# Patient Record
Sex: Female | Born: 1955 | Race: White | Hispanic: No | Marital: Married | Smoking: Current every day smoker
Health system: Southern US, Community
[De-identification: ages and names within clinical notes are randomized; demographics above are authoritative.]

## PROBLEM LIST (undated history)

## (undated) DIAGNOSIS — M1990 Unspecified osteoarthritis, unspecified site: Secondary | ICD-10-CM

## (undated) DIAGNOSIS — R7303 Prediabetes: Secondary | ICD-10-CM

## (undated) DIAGNOSIS — R112 Nausea with vomiting, unspecified: Secondary | ICD-10-CM

## (undated) DIAGNOSIS — J189 Pneumonia, unspecified organism: Secondary | ICD-10-CM

## (undated) DIAGNOSIS — D649 Anemia, unspecified: Secondary | ICD-10-CM

## (undated) DIAGNOSIS — Z9889 Other specified postprocedural states: Secondary | ICD-10-CM

## (undated) DIAGNOSIS — R51 Headache: Secondary | ICD-10-CM

## (undated) DIAGNOSIS — T7840XA Allergy, unspecified, initial encounter: Secondary | ICD-10-CM

## (undated) DIAGNOSIS — I10 Essential (primary) hypertension: Secondary | ICD-10-CM

## (undated) DIAGNOSIS — E7800 Pure hypercholesterolemia, unspecified: Secondary | ICD-10-CM

## (undated) DIAGNOSIS — R519 Headache, unspecified: Secondary | ICD-10-CM

## (undated) DIAGNOSIS — J45909 Unspecified asthma, uncomplicated: Secondary | ICD-10-CM

## (undated) HISTORY — DX: Unspecified osteoarthritis, unspecified site: M19.90

## (undated) HISTORY — DX: Unspecified asthma, uncomplicated: J45.909

## (undated) HISTORY — DX: Allergy, unspecified, initial encounter: T78.40XA

## (undated) HISTORY — PX: TONSILLECTOMY: SUR1361

---

## 1985-02-12 HISTORY — PX: BREAST SURGERY: SHX581

## 1985-02-12 HISTORY — PX: TUBAL LIGATION: SHX77

## 1987-02-13 HISTORY — PX: ABDOMINAL HYSTERECTOMY: SHX81

## 1990-02-12 HISTORY — PX: LAPAROSCOPY: SHX197

## 1995-02-13 HISTORY — PX: OTHER SURGICAL HISTORY: SHX169

## 1998-02-12 HISTORY — PX: CHOLECYSTECTOMY: SHX55

## 1998-02-12 HISTORY — PX: APPENDECTOMY: SHX54

## 2001-02-12 HISTORY — PX: ELBOW SURGERY: SHX618

## 2001-10-29 ENCOUNTER — Encounter: Payer: Self-pay | Admitting: Neurosurgery

## 2001-10-31 ENCOUNTER — Ambulatory Visit (HOSPITAL_COMMUNITY): Admission: RE | Admit: 2001-10-31 | Discharge: 2001-11-01 | Payer: Self-pay | Admitting: Neurosurgery

## 2003-02-13 HISTORY — PX: BLADDER SURGERY: SHX569

## 2003-02-13 HISTORY — PX: ANKLE SURGERY: SHX546

## 2003-04-15 ENCOUNTER — Ambulatory Visit (HOSPITAL_BASED_OUTPATIENT_CLINIC_OR_DEPARTMENT_OTHER): Admission: RE | Admit: 2003-04-15 | Discharge: 2003-04-15 | Payer: Self-pay | Admitting: Urology

## 2003-04-15 ENCOUNTER — Ambulatory Visit (HOSPITAL_COMMUNITY): Admission: RE | Admit: 2003-04-15 | Discharge: 2003-04-15 | Payer: Self-pay | Admitting: Urology

## 2003-05-24 ENCOUNTER — Ambulatory Visit (HOSPITAL_COMMUNITY): Admission: RE | Admit: 2003-05-24 | Discharge: 2003-05-24 | Payer: Self-pay

## 2004-01-26 ENCOUNTER — Ambulatory Visit (HOSPITAL_BASED_OUTPATIENT_CLINIC_OR_DEPARTMENT_OTHER): Admission: RE | Admit: 2004-01-26 | Discharge: 2004-01-26 | Payer: Self-pay | Admitting: Orthopedic Surgery

## 2005-02-12 HISTORY — PX: COLONOSCOPY: SHX174

## 2008-02-25 ENCOUNTER — Encounter: Admission: RE | Admit: 2008-02-25 | Discharge: 2008-02-25 | Payer: Self-pay | Admitting: Family Medicine

## 2010-02-12 HISTORY — PX: MENISCUS REPAIR: SHX5179

## 2010-06-30 NOTE — Op Note (Signed)
NAMECLAY, MENSER             ACCOUNT NO.:  [id number]   MEDICAL RECORD NO.:  [id number]          PATIENT TYPE:  AMB   LOCATION:  DSC                          FACILITY:  [facility]   PHYSICIAN:  Loreta Ave, M.D. DATE OF BIRTH:  10/06/1955   DATE OF PROCEDURE:  01/26/2004  DATE OF DISCHARGE:                                 OPERATIVE REPORT   PREOPERATIVE DIAGNOSES:  1.  Right ankle anterior bony impingement with tibiotalar spurs and      synovitis.  2.  Spur distal and medial tibia, symptomatic.   POSTOPERATIVE DIAGNOSES:  1.  Right ankle anterior bony impingement with tibiotalar spurs and      synovitis.  2.  Ganglion arising off posterior tibial tendon sheath, extending up over      the medial aspect of the tibia, simulating a firm body like a spur but      no bony spur present.   PROCEDURE:  1.  Right ankle exam under anesthesia arthroscopy with synovectomy and      extensive debridement of the anterior spurs to obliterate the anterior      bony impingement.  2.  Open excision of distal medial ganglion and exploration down into      posterior tibial tendon sheath.   SURGEON:  Loreta Ave, M.D.   ASSISTANT:  Zonia Kief, PA-C   ANESTHESIA:  General.   BLOOD LOSS:  Minimal.   TOURNIQUET TIME:  45 minutes.   SPECIMENS:  None.   CULTURES:  None.   COMPLICATIONS:  None.   DRESSINGS:  Soft compressive.   DESCRIPTION OF PROCEDURE:  The patient brought to the operating room, placed  on the operating table in supine position.  After adequate anesthesia had  been obtained, right ankle examined.  Good motion and stability.  Fluoroscopic guidance.  Confirmed anterior bony impingement between the  tibia and talus with about 7 degrees of dorsiflexion.  I could not really  see a bony spur along the mass on the medial aspect of the tibia.  A stirrup  applied.  Prepped and draped in the usual sterile fashion.  Extremity  elevated with elevation of Esmarch,  tourniquet inflated to 300 mmHg.  Longitudinal incision over the posteromedial aspect distal tibia just above  the medial malleolus.  Skin and subcutaneous tissue divided.  The mass,  which I thought was a spur, turned out to be a very firm cyst almost 1 cm in  diameter, excised in its entirety and tracked down behind the tibia into the  posterior tibial tendon sheath.  The neck of the ganglion and a small window  in the sheath was opened up.  I did not compromise the sheath enough to  allow any subluxation.  Some synovitis within the sheath debrided.  The  tendon below the subluxation was intact.  The wound irrigated.  Skin closed  with nylon.  Margin of the wound injected with Marcaine.  I then made  anteromedial and anterolateral portals in the ankle, protecting  neurovascular structures.  The ankle was entered with the scope, distended  and inspected.  The  anterior synovitis debrided.  Confirmed the anterior  bony spurs medial half of the tibia and on the medial aspect of the talus  that caused bone-on-bone abutment and about 7 degrees of dorsiflexion.  All  spurs were sequentially removed with a shaver and bur so at completion, I  had full dorsiflexion without any bony impingement, confirmed both  arthroscopically as well as fluoroscopically.  Remaining ankle examined  including posterior recess in both gutters.  Noted degenerative changes, no  other chondral loose bodies.  No OCD.  Instruments and fluid removed.  Portals and ankle injected with Marcaine and portals closed with nylon.  Sterile compressive dressing applied.  Tourniquet deflated and removed.  Anesthesia reversed.  Brought to recovery room.  Tolerated the surgery well,  no complications.      Valentino Saxon   DFM/MEDQ  D:  01/26/2004  T:  01/26/2004  Job:  [id number]

## 2010-06-30 NOTE — Op Note (Signed)
NAME:  Mercedes Lucas, Mercedes Lucas                       ACCOUNT NO.:  [id number]   MEDICAL RECORD NO.:  [id number]                   PATIENT TYPE:  AMB   LOCATION:  N[facility]C                                 FACILITY:  [facility]   PHYSICIAN:  Excell Seltzer. Annabell Howells, M.D.                 DATE OF BIRTH:  August 09, 1955   DATE OF PROCEDURE:  04/15/2003  DATE OF DISCHARGE:                                 OPERATIVE REPORT   PROCEDURE:  Cystoscopy, hydrodistention of the bladder, urethral dilation,  instillation of Pyridium and Marcaine.   PREOPERATIVE DIAGNOSIS:  Urgency, frequency.  Rule out interstitial  cystitis.   POSTOPERATIVE DIAGNOSIS:  Urgency, frequency.  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   SURGEON:  Excell Seltzer. Annabell Howells, M.D.   ANESTHESIA:  General.   COMPLICATIONS:  None.   INDICATIONS:  Ms. Mercedes Lucas is a 55 year old white female who had a history of  bladder tack in 1997 and who has developed some progressive problems with  urinary frequency, intermittent small voids, and a sensation of incomplete  emptying.  She has failed multiple anticholinergics.  Urodynamics revealed  detrusor instability with a bladder capacity that was quite small, only  about 50 cc before urge incontinence.  She recently underwent a potassium  sensitivity test which was mildly positive, and it was felt that cysto,  hydrodistention, urethral dilatation were indicated to rule out IC and  urethral stenosis.   FINDINGS/PROCEDURE:  Patient is given p.o. Cipro.  She is taken to the  operating room, where a general anesthetic was induced.  She is placed in  the lithotomy position.  Her perineum and genitalia were prepped with  Betadine solution.  She was draped in the usual sterile fashion.  Cystoscopy  was performed using the 22 [place] scope and 12 and 70 degree lenses.  There  was some initial difficulty passing the scope due to meatal stenosis;  however, I was able to get the scope in.  The urethra felt somewhat  fixed,  making it difficult to angulate the scope.  This would be consistent with  her prior bladder suspension.  Examination of the bladder revealed mild  trabeculation.  Ureteral orifices were in the normal anatomic position,  effluxing clear urine.  On the anterior bladder neck, there was a slightly  protruding, pale area that appeared consistent with scarring associated with  an anterior urethropexy.  Nothing suspicious for tumor was noted.  Initially, no mucosal abnormalities were appreciated.  Once thorough  cystoscopic examination had been performed, the cystoscope was removed, a  Foley catheter was inserted, the balloon was filled, and the bladder was  then distended under 80 cm of water pressure to capacity.  This was held for  five minutes.  The bladder was then drained.  The drainage was quite bloody.  The capacity under anesthesia was only 500 cc.  Recystoscopy after  hydrodistention demonstrated diffuse glomerular hemorrhaging, particularly  in  the trigone but no area of the bladder was spared.  At this point, the  bladder was drained.  The urethra was calibrated with sounds to 34.  There  was a little bit of splitting at the meatus at the 28 [place] sound level,  suggesting some degree of urethral stenosis.  Once the urethral  calibration/dilation had been performed, the bladder was instilled with  30 cc of 0.25% Marcaine with 400 mg of crushed Pyridium.  The sound was  removed, and a B&O suppository was placed.  The patient's anesthetic was  reversed.  She was taken down from the lithotomy position.  She left the  recovery room in stable condition.  There were no complications.                                               Excell Seltzer. Annabell Howells, M.D.    JJW/MEDQ  D:  04/15/2003  T:  04/15/2003  Job:  [id number]   cc:   Stacie Acres. White, M.D.  [address]  [place]  [place] [address]  Fax: [phone]

## 2010-06-30 NOTE — Op Note (Signed)
   NAME:  Mercedes Lucas, Mercedes Lucas                       ACCOUNT NO.:  [id number]   MEDICAL RECORD NO.:  [id number]                   PATIENT TYPE:  OIB   LOCATION:  3015                                 FACILITY:  [facility]   PHYSICIAN:  Coletta Memos, M.D.                  DATE OF BIRTH:  1955-05-30   DATE OF PROCEDURE:  12/19/2001  DATE OF DISCHARGE:  11/01/2001                                 OPERATIVE REPORT   PREOPERATIVE DIAGNOSIS:  Left tardy median palsy.   POSTOPERATIVE DIAGNOSIS:  Left tardy median palsy.   PROCEDURE:  Left ulnar nerve decompression.   SURGEON:  Coletta Memos, M.D.   ASSISTANT:  Danae Orleans. Venetia Maxon, M.D.   ANESTHESIA:  General endotracheal.   COMPLICATIONS:  None.   INDICATIONS:  The patient has a left ulnar nerve palsy.  I recommended and  she agreed to undergo decompression.   DESCRIPTION OF PROCEDURE:  The patient is brought to the operating room  today, intubated and placed under general anesthesia without difficulty.  Local anesthetic was placed and the proposed on the medial aspect of the  left arm and forearm.  This was done in a crescentic shape with the high  point of the crescent over the area of the medial epicondyle.  I opened the  incision and took this down carefully using bipolar cautery to control  bleeding of the subcutaneous tissues.  Cutaneous nerves were spared if  possible.  I then was able to identify the medial epicondyle and the  muscular septum.  Then with further dissection was able to identify the  cubital tunnel.  With Dr. Fredrich Birks assistance, the cubital tunnel was opened  and then this was done both proximally and distally until I was satisfied  that the nerve was well decompressed.  I was.  It did not sublux when the  patient's arm was flexed in the operation so I do not feel that  transposition was necessary.  I irrigated the wound.  I then closed the  wound in a weighted fashion using Vicryl sutures.  The patient tolerated the  procedure well after sterile dressing was applied.  She was able to move her  hand and the ulnar nerve and innovated muscles also.                                               Coletta Memos, M.D.    KC/MEDQ  D:  12/19/2001  T:  12/21/2001  Job:  [id number]

## 2012-02-20 ENCOUNTER — Other Ambulatory Visit: Payer: Self-pay | Admitting: Family Medicine

## 2012-02-20 DIAGNOSIS — Z1231 Encounter for screening mammogram for malignant neoplasm of breast: Secondary | ICD-10-CM

## 2012-02-21 ENCOUNTER — Ambulatory Visit
Admission: RE | Admit: 2012-02-21 | Discharge: 2012-02-21 | Disposition: A | Discharge status: Home / Self Care | Payer: 59 | Source: Ambulatory Visit | Attending: Family Medicine | Admitting: Family Medicine

## 2012-02-21 DIAGNOSIS — Z1231 Encounter for screening mammogram for malignant neoplasm of breast: Secondary | ICD-10-CM

## 2012-03-05 ENCOUNTER — Ambulatory Visit: Payer: 59

## 2012-03-05 ENCOUNTER — Ambulatory Visit (INDEPENDENT_AMBULATORY_CARE_PROVIDER_SITE_OTHER): Payer: 59 | Admitting: Family Medicine

## 2012-03-05 VITALS — BP 148/84 | HR 82 | Temp 97.9°F | Resp 16 | Ht 65.0 in | Wt 170.0 lb

## 2012-03-05 DIAGNOSIS — S63259A Unspecified dislocation of unspecified finger, initial encounter: Secondary | ICD-10-CM

## 2012-03-05 DIAGNOSIS — S62609A Fracture of unspecified phalanx of unspecified finger, initial encounter for closed fracture: Secondary | ICD-10-CM

## 2012-03-05 DIAGNOSIS — M79646 Pain in unspecified finger(s): Secondary | ICD-10-CM

## 2012-03-05 DIAGNOSIS — M79609 Pain in unspecified limb: Secondary | ICD-10-CM

## 2012-03-05 MED ORDER — TRAMADOL HCL 50 MG PO TABS: 50.0000 mg | ORAL_TABLET | Freq: Three times a day (TID) | ORAL | Status: DC | PRN

## 2012-03-05 NOTE — Progress Notes (Signed)
Chief complaint fall at work with left hand pain, and back pain.  History of present illness: Patient is a very pleasant 57 year old female who today when she was getting out of her car slipped on ice and fell onto her back. Patient did attempt to try to catch herself with her left hand and twisted a couple of her fingers. Patient states that she had pain in the hand as well as pain on her buttocks as well as her upper back. Patient then had enough pain that she decided to come here to be evaluated. Patient states that this did occur approximately 2-3 hours ago. Patient has not taken anything for pain at this time. Of all the injury is the thing that hurts most is the finger pain. Patient states that this feels somewhat separately and sensation and has trouble moving her fingers. Patient does notice some swelling of the fingers mostly being the ring and middle finger.  Past Medical History  Diagnosis Date  . Allergy   . Asthma   . Arthritis    Past Surgical History  Procedure Date  . Appendectomy   . Breast surgery   . Cholecystectomy   . Abdominal hysterectomy   . Tubal ligation    Family History  Problem Relation Age of Onset  . Cancer Mother   . Cancer Father   . Heart disease Father    History  Substance Use Topics  . Smoking status: Current Every Day Smoker -- 0.5 packs/day for 20 years  . Smokeless tobacco: Not on file  . Alcohol Use: No   Physical exam Blood pressure 148/84, pulse 82, temperature 97.9 F (36.6 C), temperature source Oral, resp. rate 16, height 5\' 5"  (1.651 m), weight 170 lb (77.111 kg), SpO2 97.00%. General: No apparent distress alert and oriented x3 Left hand exam: On inspection patient does have trace effusion of the DMP and PIP joints of the ring and middle finger. There is no significant angulation of the that his fingers. She is neurovascularly intact distally. Patient's metacarpals as well as carpal bones are nontender on exam. Patient has no pain over  the distal radius or ulna. Patient has no pain at the elbow. Patient does have full flexion and extension of the DIP joints bilaterally. Back exam: On inspection patient does have a small abrasion in the mid of her back in the thoracic area just to the right of midline. This is very tender to touch and does have some signs of bruising already. Patient has no spinous process tenderness. Patient has full-strength of her upper extremities. Patient is minorly tender over the sacrum but overall is able to sit without any pain. Patient has a negative straight leg test bilaterally and a negative Faber test bilaterally.  X-rays were ordered and reviewed by me today. Patient did have a subluxation of the middle finger on the left hand as well as a volar plate fracture that is extra-articular.  Assessment: Fuller plate fracture of the middle finger on the left hand after subluxation which was reduced today. Patient tolerated the procedure well without any significant side effect. Patient was put in a 30 flexion splint which she was told to wear for 3 weeks. Patient was given a prescription for tramadol to help with pain. Patient Re: has a prescription for meloxicam. Patient will followup again in 2-3 weeks for followup x-rays to make sure healing well and pain is improving.  Regarding her back abrasion. She will continue with the tramadol and they will  improve slowly. Warned patient that likely she will be somewhat this for tomorrow. We will keep her out of work for one day and then she can resume work regularly without any restrictions on Friday. Patient will call if she has any setbacks.

## 2012-03-11 NOTE — Addendum Note (Signed)
Addended by: Ethelda Chick on: 03/11/2012 02:04 PM   Modules accepted: Level of Service

## 2012-03-11 NOTE — Progress Notes (Signed)
Reviewed history and physical exam in detail with Antoine Primas, DO.  Reviewed xrays in detail.  Agree with current assessment and plan.

## 2012-03-12 NOTE — Progress Notes (Signed)
Reviewed and agree.

## 2012-03-25 ENCOUNTER — Ambulatory Visit: Payer: 59

## 2012-03-25 ENCOUNTER — Ambulatory Visit (INDEPENDENT_AMBULATORY_CARE_PROVIDER_SITE_OTHER): Payer: 59 | Admitting: Family Medicine

## 2012-03-25 VITALS — BP 156/89 | HR 77 | Temp 98.4°F | Resp 17 | Ht 66.0 in | Wt 170.0 lb

## 2012-03-25 DIAGNOSIS — S62609A Fracture of unspecified phalanx of unspecified finger, initial encounter for closed fracture: Secondary | ICD-10-CM

## 2012-03-25 DIAGNOSIS — IMO0002 Reserved for concepts with insufficient information to code with codable children: Secondary | ICD-10-CM

## 2012-03-25 DIAGNOSIS — Z09 Encounter for follow-up examination after completed treatment for conditions other than malignant neoplasm: Secondary | ICD-10-CM

## 2012-03-25 NOTE — Patient Instructions (Addendum)
Thank you for coming in today. We will buddy tape your fingers.  Go back to work Monday.  Come back in 2 weeks on Tuesday or Wednesday night.

## 2012-03-25 NOTE — Progress Notes (Signed)
Mercedes Lucas is a 57 y.o. female who presents to [facility] today for followup left long proximal phalanx volar plate fracture.  She feels well in the split notes moderate pain. However she's been unable to work because the splint is clumsy and  her job is hand dominant.  She denies any radiating pain weakness or numbness fevers or chill.  She has been compliant with the splint.   PMH: Reviewed significant for hyperlipidemia, COPD History  Substance Use Topics  . Smoking status: Current Every Day Smoker -- 1.00 packs/day for 43 years    Types: Cigarettes  . Smokeless tobacco: Not on file  . Alcohol Use: No   ROS as above  Medications reviewed. Current Outpatient Prescriptions  Medication Sig Dispense Refill  . Estrogens Conjugated (PREMARIN PO) Take 1 tablet by mouth daily.      . Fluticasone-Salmeterol (ADVAIR HFA IN) Inhale 1 puff into the lungs daily.      . traMADol (ULTRAM) 50 MG tablet Take 1 tablet (50 mg total) by mouth every 8 (eight) hours as needed for pain.  30 tablet  0   No current facility-administered medications for this visit.    Exam:  BP 156/89  Pulse 77  Temp(Src) 98.4 F (36.9 C) (Oral)  Resp 17  Ht 5\' 6"  (1.676 m)  Wt 170 lb (77.111 kg)  BMI 27.45 kg/m2  SpO2 96% Gen: Well NAD LEFT LONG FINGER: Ecchymosis and swelling on the volar aspect of the PIP.  Normal extension flexion to 90 of the PIP.  Tender to palpation.  Normal capillary refill and sensation distally.  Three-view left finger: Small extra-articular proximal aspect of the middle phalanx 10% volar plate fracture.  Nondisplaced.  Assessment and Plan: 57 y.o. female with 3 weeks status post volar plate fracture with dislocation.  Plan: Buddy tape. Return to work light duty.  Followup 2 weeks for repeat x-rays and evaluation.

## 2012-03-26 NOTE — Progress Notes (Signed)
Xray read and patient discussed with Dr. Corey. Agree with assessment and plan of care per his note.   

## 2012-04-08 ENCOUNTER — Ambulatory Visit: Payer: 59

## 2012-04-08 ENCOUNTER — Ambulatory Visit (INDEPENDENT_AMBULATORY_CARE_PROVIDER_SITE_OTHER): Payer: 59 | Admitting: Internal Medicine

## 2012-04-08 VITALS — BP 140/80 | HR 66 | Temp 97.5°F | Resp 18 | Ht 65.5 in | Wt 169.4 lb

## 2012-04-08 DIAGNOSIS — S62609D Fracture of unspecified phalanx of unspecified finger, subsequent encounter for fracture with routine healing: Secondary | ICD-10-CM

## 2012-04-08 DIAGNOSIS — IMO0002 Reserved for concepts with insufficient information to code with codable children: Secondary | ICD-10-CM

## 2012-04-08 NOTE — Patient Instructions (Addendum)
Thank you for coming in today. Please go to hand physical therapy for a visit or two.  Buddy tape at home.  Come see me or Dr. Katrinka Blazing in 2 weeks Tuesday or Wednesday nights.

## 2012-04-08 NOTE — Progress Notes (Signed)
Mercedes Lucas is a 57 y.o. female who presents to [facility] today for followup left finger fracture. She was last seen on March 25, 2012 for a left long proximal phalanx volar plate fracture.  She's been treated with buddy taping for the last 2 weeks.  She notes continued stiffness and mild pain in the PIP.  No radiating pain weakness or numbness.  Back to full duty at work  History  Substance Use Topics  . Smoking status: Current Every Day Smoker -- 1.00 packs/day for 43 years    Types: Cigarettes  . Smokeless tobacco: Not on file  . Alcohol Use: No   ROS as above  Medications reviewed. Current Outpatient Prescriptions  Medication Sig Dispense Refill  . Albuterol Sulfate (VENTOLIN HFA IN) Inhale into the lungs. 1-2 puffs q 6 hrs prn      . Estrogens Conjugated (PREMARIN PO) Take 1 tablet by mouth daily.      . Fluticasone-Salmeterol (ADVAIR HFA IN) Inhale 1 puff into the lungs daily. 100/50      . lisinopril-hydrochlorothiazide (PRINZIDE,ZESTORETIC) 20-12.5 MG per tablet Take 1 tablet by mouth daily.      . meloxicam (MOBIC) 7.5 MG tablet Take 7.5 mg by mouth daily.      . montelukast (SINGULAIR) 10 MG tablet Take 10 mg by mouth at bedtime.      . traMADol (ULTRAM) 50 MG tablet Take 1 tablet (50 mg total) by mouth every 8 (eight) hours as needed for pain.  30 tablet  0   No current facility-administered medications for this visit.    Exam:  BP 140/80  Pulse 66  Temp(Src) 97.5 F (36.4 C) (Oral)  Resp 18  Ht 5' 5.5" (1.664 m)  Wt 169 lb 6.4 oz (76.839 kg)  BMI 27.75 kg/m2  SpO2 96% Gen: Well NAD LEFT LONG FINGER: Ecchymosis and swelling on the volar aspect of the PIP.  Normal extension flexion to 90 of the PIP.  Tender to palpation.  Normal capillary refill and sensation distally.   3v left finger:  Healing volar plate fracture of the long finger.  Assessment and Plan: 57 y.o. female with four-week status post volar plate fracture.  Continued stiffness.  Healing well.

## 2012-11-07 ENCOUNTER — Emergency Department (HOSPITAL_COMMUNITY)
Admission: EM | Admit: 2012-11-07 | Discharge: 2012-11-07 | Disposition: A | Discharge status: Home / Self Care | Payer: 59 | Source: Home / Self Care | Attending: Emergency Medicine | Admitting: Emergency Medicine

## 2012-11-07 ENCOUNTER — Encounter (HOSPITAL_COMMUNITY): Payer: Self-pay | Admitting: *Deleted

## 2012-11-07 DIAGNOSIS — H6691 Otitis media, unspecified, right ear: Secondary | ICD-10-CM

## 2012-11-07 DIAGNOSIS — J209 Acute bronchitis, unspecified: Secondary | ICD-10-CM

## 2012-11-07 MED ORDER — ALBUTEROL SULFATE HFA 108 (90 BASE) MCG/ACT IN AERS: 1.0000 | INHALATION_SPRAY | Freq: Four times a day (QID) | RESPIRATORY_TRACT | Status: DC | PRN

## 2012-11-07 MED ORDER — AMOXICILLIN-POT CLAVULANATE 875-125 MG PO TABS: 1.0000 | ORAL_TABLET | Freq: Two times a day (BID) | ORAL | Status: DC

## 2012-11-07 MED ORDER — HYDROCOD POLST-CHLORPHEN POLST 10-8 MG/5ML PO LQCR: 5.0000 mL | Freq: Two times a day (BID) | ORAL | Status: DC | PRN

## 2012-11-07 MED ORDER — PREDNISONE 20 MG PO TABS: 20.0000 mg | ORAL_TABLET | Freq: Two times a day (BID) | ORAL | Status: DC

## 2012-11-07 NOTE — ED Notes (Signed)
Pt reports 2 day history of having a cold, fever, body aches, and chills. Pt denies pain. Pt states that her husband is currently experiencing the same symptoms.

## 2012-11-07 NOTE — ED Provider Notes (Signed)
Chief Complaint:   Chief Complaint  Patient presents with  . Influenza    History of Present Illness:   Mercedes Lucas is a 57 year old female who has had a three-day history of a cough productive of white to yellow to green sputum, chest tightness and wheezing, chills, subjective fever, rhinorrhea, sore throat, greenish nasal drainage, and back pain. She is an asthmatic and takes Advair daily and albuterol as needed. She is also a cigarette smoker.  Review of Systems:  Other than noted above, the patient denies any of the following symptoms: Systemic:  No fevers, chills, sweats, weight loss or gain, fatigue, or tiredness. Eye:  No redness or discharge. ENT:  No ear pain, drainage, headache, nasal congestion, drainage, sinus pressure, difficulty swallowing, or sore throat. Neck:  No neck pain or swollen glands. Lungs:  No cough, sputum production, hemoptysis, wheezing, chest tightness, shortness of breath or chest pain. GI:  No abdominal pain, nausea, vomiting or diarrhea.  PMFSH:  Past medical history, family history, social history, meds, and allergies were reviewed. She's allergic to erythromycin. She takes albuterol, Premarin, Advair, lisinopril/HCTZ, Mobic, Singulair, and tramadol.  Physical Exam:   Vital signs:  BP 158/96  Temp(Src) 98 F (36.7 C) (Oral)  SpO2 99% General:  Alert and oriented.  In no distress.  Skin warm and dry. Eye:  No conjunctival injection or drainage. Lids were normal. ENT:  The right TM was mildly erythematous. There was no pus or fluid behind the TM and canal is clear, left TM and canal are normal.  Nasal mucosa was clear and uncongested, without drainage.  Mucous membranes were moist.  Pharynx was clear with no exudate or drainage.  There were no oral ulcerations or lesions. Neck:  Supple, no adenopathy, tenderness or mass. Lungs:  No respiratory distress.  Lungs were clear to auscultation, without wheezes, or rales but she did have some low pitched  rhonchi. Breath sounds were clear and equal bilaterally.  Heart:  Regular rhythm, without gallops, murmers or rubs. Skin:  Clear, warm, and dry, without rash or lesions.  Assessment:  The primary encounter diagnosis was Acute bronchitis. A diagnosis of Right otitis media was also pertinent to this visit.  Probable acute exacerbation of chronic bronchitis.  Plan:   1.  Meds:  The following meds were prescribed:   Discharge Medication List as of 11/07/2012  8:39 PM    START taking these medications   Details  chlorpheniramine-HYDROcodone (TUSSIONEX) 10-8 MG/5ML LQCR Take 5 mLs by mouth every 12 (twelve) hours as needed., Starting 11/07/2012, Until Discontinued, Normal       Also was given Augmentin 875 mg, #20 tablets, to take 1 twice a day, and albuterol inhaler, to take 2 puffs every 4 hours as needed, and prednisone 20 mg, #10 tablets, to take 1 twice a day.  2.  Patient Education/Counseling:  The patient was given appropriate handouts, self care instructions, and instructed in symptomatic relief.  She was strongly encouraged to quit smoking.  3.  Follow up:  The patient was told to follow up if no better in 3 to 4 days, if becoming worse in any way, and given some red flag symptoms such as fever or worsening respiratory difficulty which would prompt immediate return.  Follow up here if needed.      Reuben Likes, MD 11/07/12 2153

## 2012-11-12 NOTE — ED Notes (Signed)
Patient called to c/o thet pharmacy has no record of her CIO for tessalon pearles. Called pharmacy again, and spoke directly w pharmacist

## 2012-11-12 NOTE — ED Notes (Signed)
Call from patient, c/o she has unresolved cough that keeps her from sleeping. Spoke w Dr Lorenz Coaster, who wants her to fonish antibiotics previously prescribed, and add tessalon pearls 200 mg, po, #30, 1 q 8 h prn cough. Called in to [place] [place] at patient request, left message on answering machine

## 2013-08-24 IMAGING — CR DG HAND COMPLETE 3+V*L*
3 series · 3 of 3 positions shown · non-contrast
Comparison: None.

CLINICAL DATA: Prior third digit PIP joint dislocation.

LEFT HAND - COMPLETE 3+ VIEW

[PA]
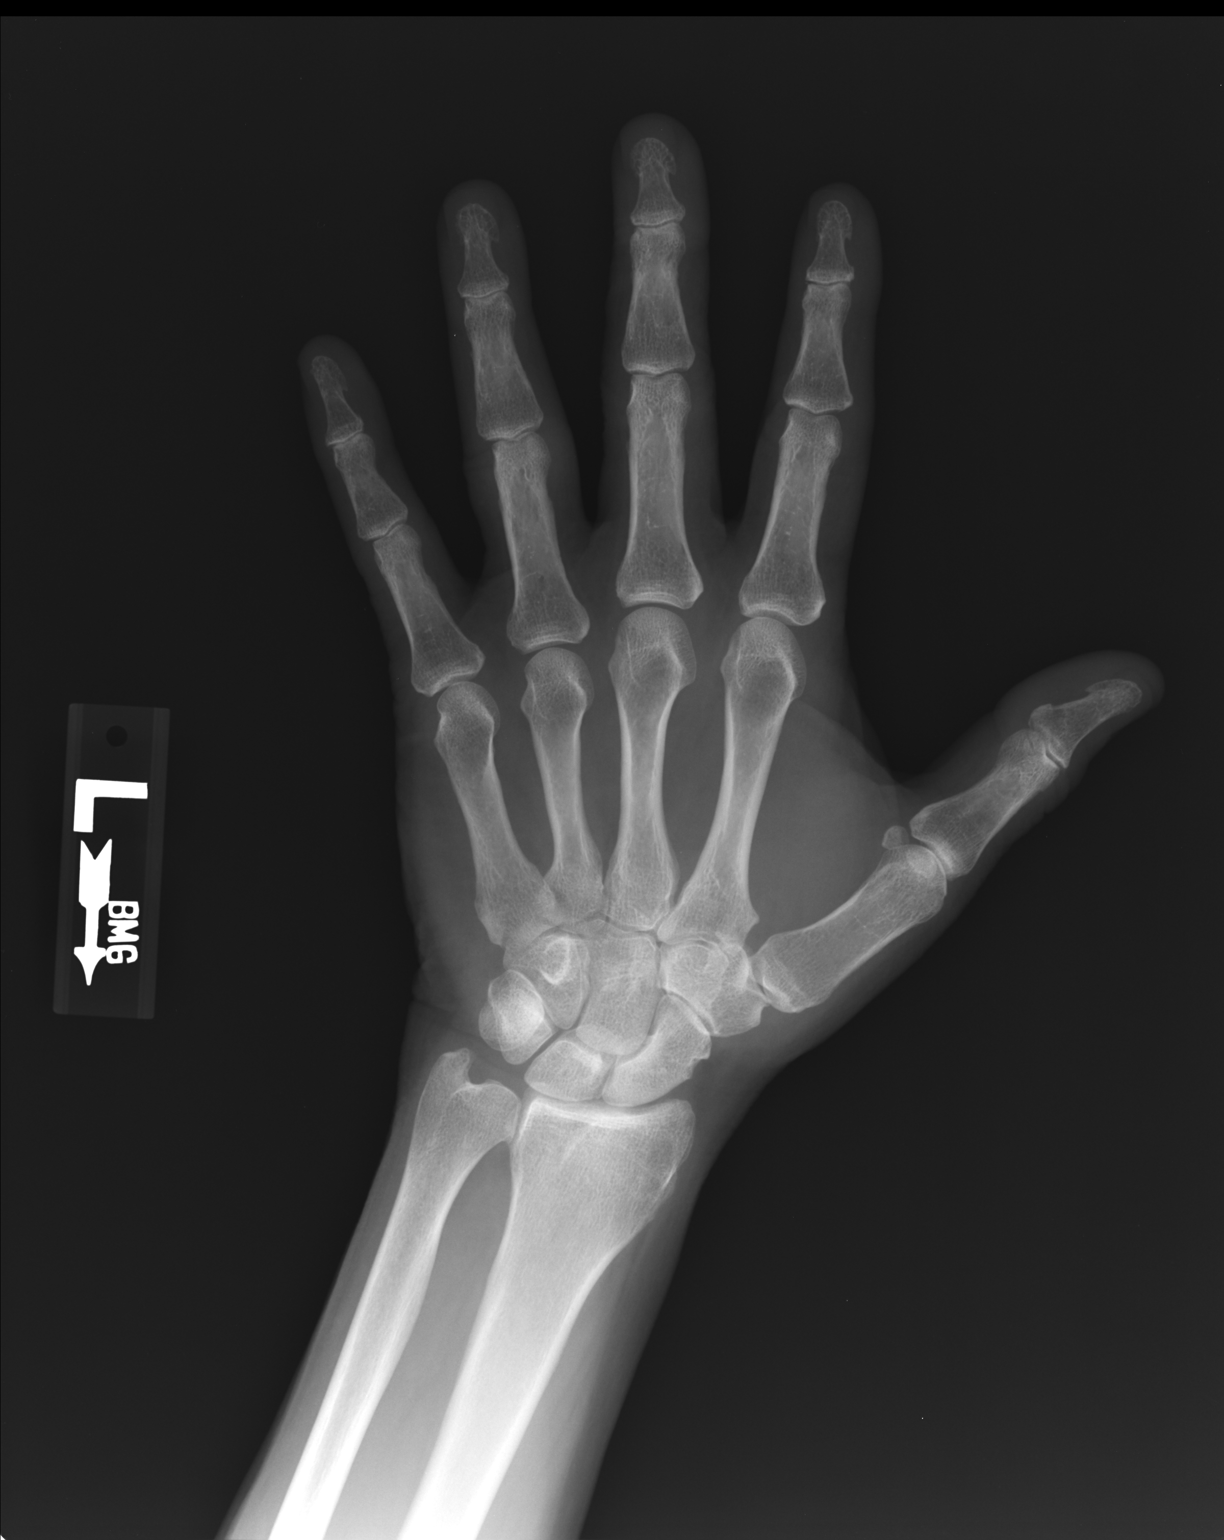

[pa obl]
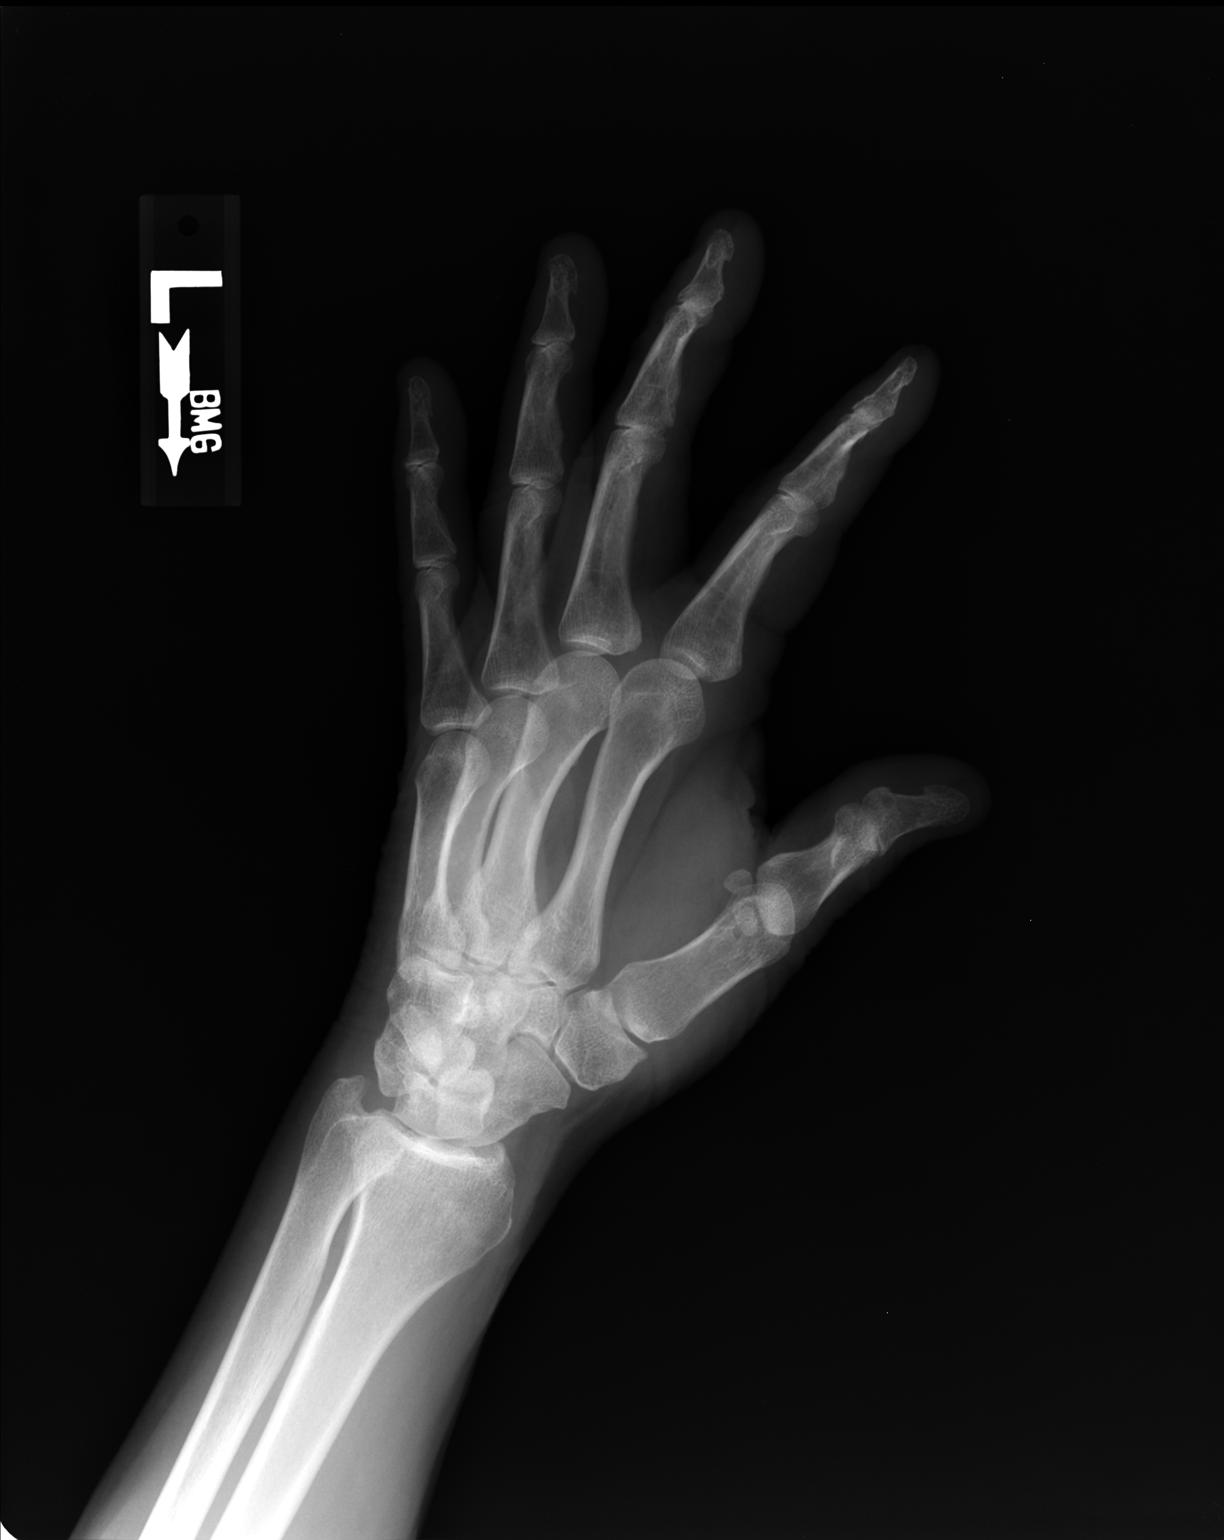

[lateral]
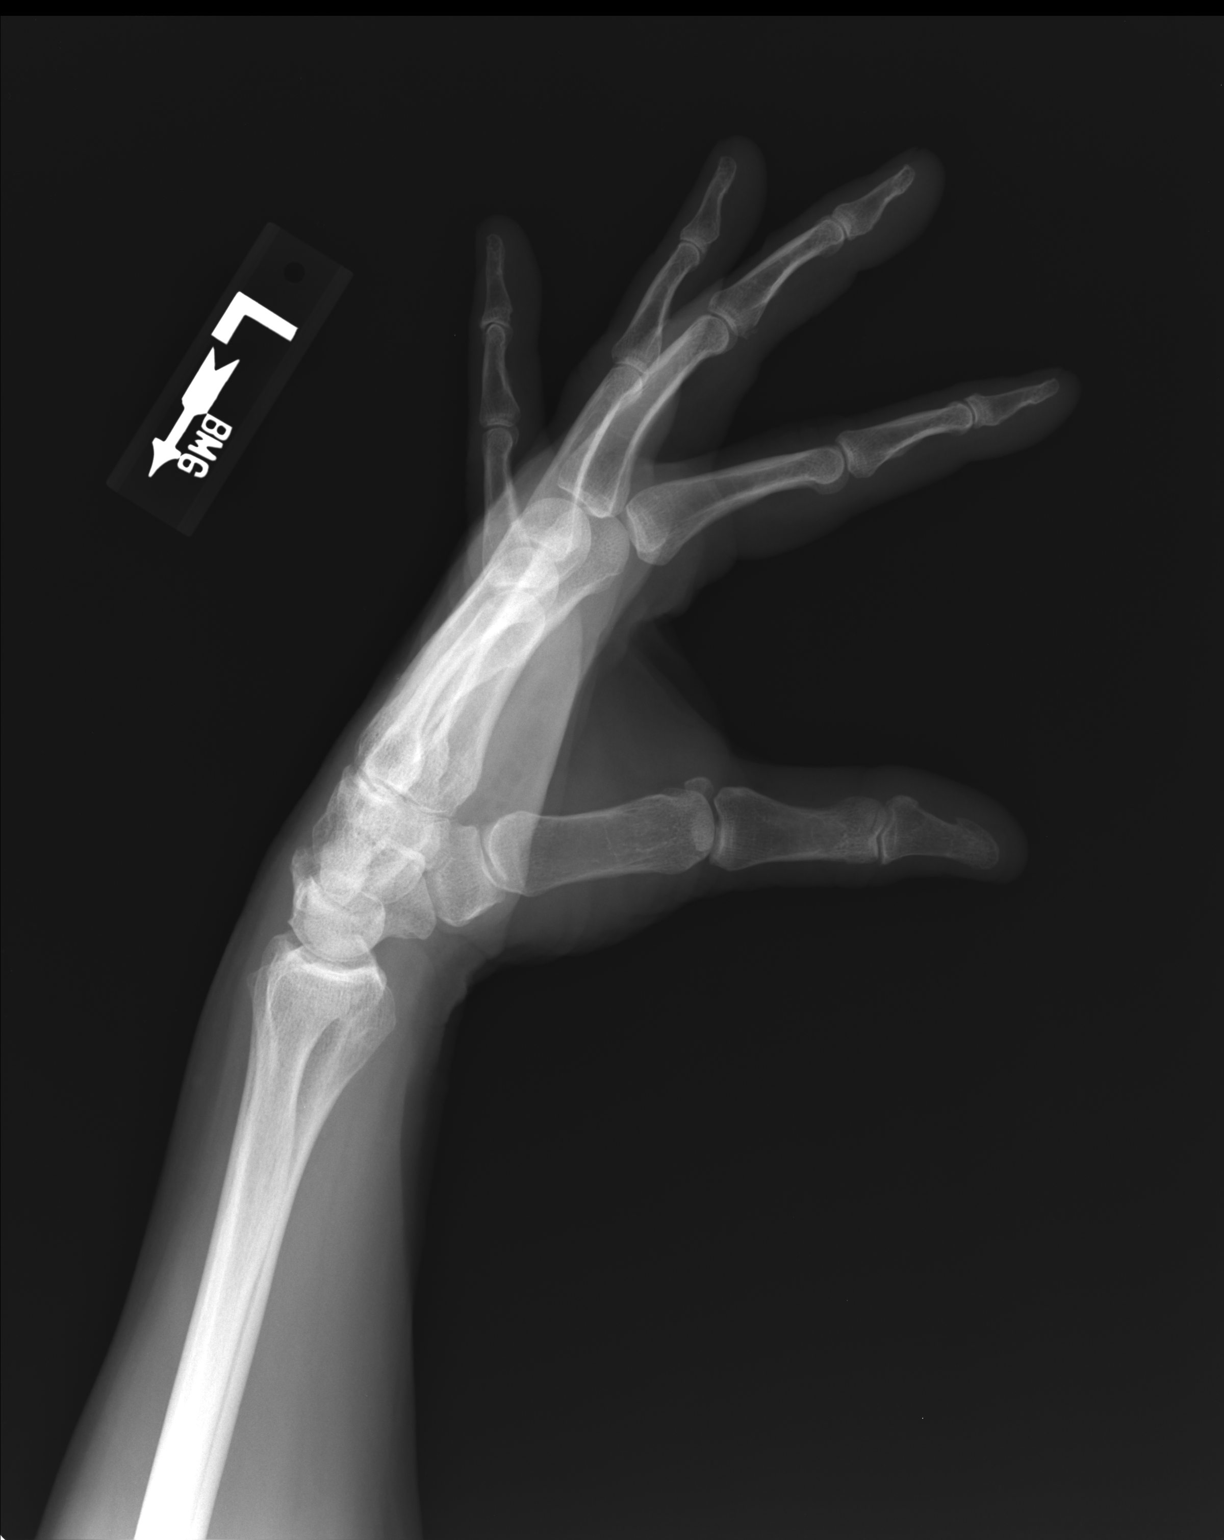

[3 of 3 positions shown; findings below may reference images not displayed]

FINDINGS: Satisfactory reduction of the proximal interphalangeal
joint noted.  Small bony fragment noted volar to the base of the
middle phalanx on the lateral projection.

No new fracture observed.
IMPRESSION: 1.  Successful reduction.  Linear bony fragment projects just volar
to the proximal base of the middle phalanx of the third finger.

## 2014-02-08 ENCOUNTER — Ambulatory Visit (INDEPENDENT_AMBULATORY_CARE_PROVIDER_SITE_OTHER): Payer: 59 | Admitting: Urgent Care

## 2014-02-08 VITALS — BP 124/70 | HR 79 | Temp 97.5°F | Resp 18 | Ht 65.0 in | Wt 177.0 lb

## 2014-02-08 DIAGNOSIS — J309 Allergic rhinitis, unspecified: Secondary | ICD-10-CM

## 2014-02-08 DIAGNOSIS — H6983 Other specified disorders of Eustachian tube, bilateral: Secondary | ICD-10-CM

## 2014-02-08 DIAGNOSIS — R0981 Nasal congestion: Secondary | ICD-10-CM

## 2014-02-08 MED ORDER — MONTELUKAST SODIUM 10 MG PO TABS: 10.0000 mg | ORAL_TABLET | Freq: Every day | ORAL | Status: DC

## 2014-02-08 NOTE — Progress Notes (Signed)
    MRN: [id number] DOB: 05/20/1955  Subjective:   Mercedes Lucas is a 58 y.o. female presenting for 1 week history of sinus congestion. States that about 4 weeks ago, had a really bad cold that last 2-3 weeks, felt like it was resolving but then started back up with severe sinus congestion in the last week. Associated symptoms include itchy watery eyes, ear popping, "bad" taste in her mouth, scratchy throat, intermittent dry cough. Has tried multiple otc products, decongestants, mucinex, hot tea, honey with minimal to no relief. Denies fevers, sinus pain, ear pain, ear drainage, tooth pain, sore throat, chest pain, chest tightness, shob, wheezing, n/v, abdominal pain. Of note, has had a sick contact, her mother-in-law with similar symptoms. 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 Denies history of seasonal allergies. Has a history of asthma, takes inhaler as needed. Smokes 1/2 ppd, no alcohol use, no illicit drug use. Denies any other aggravating or relieving factors, no other questions or concerns.  Mercedes Lucas has a current medication list which includes the following prescription(s): albuterol, estrogens conjugated, fluticasone-salmeterol, lisinopril-hydrochlorothiazide, meloxicam, and tramadol.  She is allergic to erythromycin and codeine.  Mercedes Lucas  has a past medical history of Allergy; Asthma; and Arthritis. Also  has past surgical history that includes Appendectomy; Breast surgery; Cholecystectomy; Abdominal hysterectomy; Tubal ligation; and Meniscus repair (2013).  ROS As in subjective.  Objective:   Vitals: BP 124/70 mmHg  Pulse 79  Temp(Src) 97.5 F (36.4 C) (Oral)  Resp 18  Ht 5\' 5"  (1.651 m)  Wt 177 lb (80.287 kg)  BMI 29.45 kg/m2  SpO2 95%  Physical Exam  Constitutional: She is oriented to person, place, and time and well-developed, well-nourished, and in no distress.  HENT:  TM's flat bilaterally, no effusions or erythema. Nasal turbinates  inflamed and edematous, clear rhinorrhea. Postnasal drip present, without oropharyngeal exudates or tonsillar abscesses. Mucous membranes moist, without white plaques. Dentition in good repair, no obvious tooth abnormalities.  Eyes: Conjunctivae and EOM are normal. Right eye exhibits no discharge. Left eye exhibits no discharge.  Neck: Normal range of motion.  Cardiovascular: Normal rate, regular rhythm, normal heart sounds and intact distal pulses.  Exam reveals no gallop and no friction rub.   No murmur heard. Pulmonary/Chest: Effort normal and breath sounds normal. No stridor. No respiratory distress. She has no wheezes. She has no rales. She exhibits no tenderness.  Abdominal: Bowel sounds are normal.  Lymphadenopathy:    She has no cervical adenopathy.  Neurological: She is alert and oriented to person, place, and time.  Skin: Skin is warm and dry. No rash noted. She is not diaphoretic. No erythema.  Psychiatric: Mood and affect normal.    Assessment and Plan :   1. Allergic rhinitis, unspecified allergic rhinitis type 2. Eustachian tube dysfunction, bilateral 3. Nasal congestion - PE findings reassuring for r/o infectious process, counseled patient on difficulty treating ETD, will try to control allergies - Restart singulair, start flonase daily, mucinex 1,200mg  BID - Return to clinic if symptoms worsen, fail to resolve or as needed  Wallis BambergMario Ezana Hubbert, PA-C [facility] [phone] 02/08/2014 4:19 PM

## 2014-02-08 NOTE — Patient Instructions (Signed)
Barotitis Media Barotitis media is inflammation of your middle ear. This occurs when the auditory tube (eustachian tube) leading from the back of your nose (nasopharynx) to your eardrum is blocked. This blockage may result from a cold, environmental allergies, or an upper respiratory infection. Unresolved barotitis media may lead to damage or hearing loss (barotrauma), which may become permanent. HOME CARE INSTRUCTIONS   Use medicines as recommended by your health care provider. Over-the-counter medicines will help unblock the canal and can help during times of air travel.  Do not put anything into your ears to clean or unplug them. Eardrops will not be helpful.  Do not swim, dive, or fly until your health care provider says it is all right to do so. If these activities are necessary, chewing gum with frequent, forceful swallowing may help. It is also helpful to hold your nose and gently blow to pop your ears for equalizing pressure changes. This forces air into the eustachian tube.  Only take over-the-counter or prescription medicines for pain, discomfort, or fever as directed by your health care provider.  A decongestant may be helpful in decongesting the middle ear and make pressure equalization easier. SEEK MEDICAL CARE IF:  You experience a serious form of dizziness in which you feel as if the room is spinning and you feel nauseated (vertigo).  Your symptoms only involve one ear. SEEK IMMEDIATE MEDICAL CARE IF:   You develop a severe headache, dizziness, or severe ear pain.  You have bloody or pus-like drainage from your ears.  You develop a fever.  Your problems do not improve or become worse. MAKE SURE YOU:   Understand these instructions.  Will watch your condition.  Will get help right away if you are not doing well or get worse. Document Released: 01/27/2000 Document Revised: 11/19/2012 Document Reviewed: 08/26/2012 ExitCare Patient Information 2015 [employer]. This  information is not intended to replace advice given to you by your health care provider. Make sure you discuss any questions you have with your health care provider.  

## 2014-05-18 ENCOUNTER — Telehealth: Payer: Self-pay | Admitting: Urgent Care

## 2014-05-18 NOTE — Telephone Encounter (Signed)
Patient dropped off surgical clearance form. She states that she is having surgery on 05/20/2014. Advised patient that she may need an OV in order for the form to be completed (last OV was 01/2014). [facility] because he was the last provider to see this patient. Please call patient and let her know if form can be signed without an OV or if she needs to come back for an OV in order to have this signed for her.  Thanks, Pecan GapJasmine   [phone]

## 2014-05-19 ENCOUNTER — Ambulatory Visit (INDEPENDENT_AMBULATORY_CARE_PROVIDER_SITE_OTHER): Payer: 59 | Admitting: Family Medicine

## 2014-05-19 VITALS — BP 170/110 | HR 71 | Temp 98.0°F | Resp 18 | Ht 65.0 in | Wt 179.0 lb

## 2014-05-19 DIAGNOSIS — R8290 Unspecified abnormal findings in urine: Secondary | ICD-10-CM

## 2014-05-19 DIAGNOSIS — Z01810 Encounter for preprocedural cardiovascular examination: Secondary | ICD-10-CM

## 2014-05-19 DIAGNOSIS — Z720 Tobacco use: Secondary | ICD-10-CM

## 2014-05-19 DIAGNOSIS — M1712 Unilateral primary osteoarthritis, left knee: Secondary | ICD-10-CM | POA: Diagnosis not present

## 2014-05-19 DIAGNOSIS — I10 Essential (primary) hypertension: Secondary | ICD-10-CM | POA: Diagnosis not present

## 2014-05-19 DIAGNOSIS — F17200 Nicotine dependence, unspecified, uncomplicated: Secondary | ICD-10-CM

## 2014-05-19 LAB — BASIC METABOLIC PANEL
BUN: 16 mg/dL (ref 6–23)
CHLORIDE: 103 meq/L (ref 96–112)
CO2: 27 mEq/L (ref 19–32)
Calcium: 9.7 mg/dL (ref 8.4–10.5)
Creat: 0.58 mg/dL (ref 0.50–1.10)
GLUCOSE: 85 mg/dL (ref 70–99)
POTASSIUM: 4.7 meq/L (ref 3.5–5.3)
Sodium: 141 mEq/L (ref 135–145)

## 2014-05-19 LAB — POCT URINALYSIS DIPSTICK
BILIRUBIN UA: NEGATIVE
Blood, UA: NEGATIVE
GLUCOSE UA: NEGATIVE
Ketones, UA: NEGATIVE
Leukocytes, UA: NEGATIVE
NITRITE UA: POSITIVE
Protein, UA: 100
Spec Grav, UA: 1.025
UROBILINOGEN UA: 0.2
pH, UA: 5

## 2014-05-19 LAB — POCT UA - MICROSCOPIC ONLY
CASTS, UR, LPF, POC: NEGATIVE
CRYSTALS, UR, HPF, POC: NEGATIVE
Mucus, UA: NEGATIVE
RBC, URINE, MICROSCOPIC: NEGATIVE
Yeast, UA: NEGATIVE

## 2014-05-19 MED ORDER — AMLODIPINE BESYLATE 5 MG PO TABS: 5.0000 mg | ORAL_TABLET | Freq: Every day | ORAL | Status: DC

## 2014-05-19 MED ORDER — LISINOPRIL-HYDROCHLOROTHIAZIDE 20-12.5 MG PO TABS: 1.0000 | ORAL_TABLET | Freq: Every day | ORAL | Status: DC

## 2014-05-19 NOTE — Telephone Encounter (Signed)
Spoke with pt--she is going to make appt to come in.

## 2014-05-19 NOTE — Addendum Note (Signed)
Addended by: Vira AgarFARRINGTON, Kenzee Bassin L on: 05/19/2014 02:56 PM   Modules accepted: Orders

## 2014-05-19 NOTE — Telephone Encounter (Signed)
Please call patient and let her know she needs to come in for an office visit. I am not her PCP and I saw her 4 months ago for allergies which is an entirely different visit and definitely not adequate for surgical clearance given both timing and scope of physical exam. She is welcome to return to [facility] who is listed as her PCP as well. Or come in to see any provider at the walk-in-clinic.   The form for her surgical clearance that was placed in my box will remain there for any provider who sees her if that is not me.  Thank you, Wallis BambergMario Rozelle Caudle, PA-C [facility] [phone] 05/19/2014  10:03 AM

## 2014-05-19 NOTE — Progress Notes (Signed)
PAT appt 05/20/2014. Need orders placed in epic. Thanks.

## 2014-05-19 NOTE — Patient Instructions (Addendum)
Take the lisinopril HCT 1 daily for blood pressure  Take the amlodipine 1 daily for blood pressure  Work hard at getting off the cigarettes  Return in 5 days for a recheck  Avoid excessive salt  Get regular exercise

## 2014-05-19 NOTE — Progress Notes (Signed)
Please put orders in Epic surgery 06-01-14 pre op 05-20-14 Thanks

## 2014-05-19 NOTE — Progress Notes (Signed)
Preop medical evaluation  History: 59 year old lady who is here for her checkup before having a knee replaced in 2 weeks. She has no major acute medical complaints. She stays active and healthy [place] for [place] at [employer]. She has to run up and down stairs a lot as well as working out to the loading area up and down the, course.  Past medical history: Surgeries: Cholecystectomy, appendectomy, meniscus repair, left elbow surgery Gravida 2 para 2 Medical illnesses: Hypertension (has not been taking her medicines) Regular medications: She has not been taking her regular medications. She was supposed to be on blood pressure medicine she is apparently, but has not taken that. Medication allergies: Erythromycin and codeine. Both codeine and hydrocodone upset her stomach but did not have a major allergic reaction to them.  Social history: Patient is married, has 2 grown children and 2 grandchildren. She works regularly, rarely misses work. She smokes about one third pack cigarettes a day. Drinks about 1 drink a year. No drug use.  Review of systems: Constitutional: Unremarkable HEENT: Unremarkable Cardiovascular: Denies chest pains or dizziness or palpitations Respiratory: As a history of minimal exertional asthma, but only occasionally has to use her inhaler. Gastrointestinal: Unremarkable Genitourinary: Unremarkable Neurological: Unremarkable Orthopedic: Knee pain on left Dermatologic: Unremarkable Psychiatric: Unremarkable Endocrine: Unremarkable   Physical examination: Repeat blood pressure that I took was 210/122 in each arm Somewhat overweight lady in no acute distress, talkative and pleasant. Her TMs are normal. Eyes PERRLA. Throat clear. Neck supple without nodes or thyromegaly. No carotid bruits. Chest is clear to auscultation. Heart regular without murmurs. Soft without mass or tenderness. No ankle edema. No nodes.  Assessment: Preop medical  examination Hypertension, uncontrolled due to noncompliance DJD of knee History of exertional asthma  Plan: We'll go ahead and check an EKG, be met, and urinalysis. Will need to get her back on medications. Will need to have blood pressure rechecked between now and surgery to make sure it is controlled. Surgery cannot be cleared for until her blood pressure is controlled. Cautioned will be needed at surgery due to her history of asthma.  EKG has a single PAC, no acute changes  Results for orders placed or performed in visit on 05/19/14  POCT urinalysis dipstick  Result Value Ref Range   Color, UA yellow    Clarity, UA cloudy    Glucose, UA neg    Bilirubin, UA neg    Ketones, UA neg    Spec Grav, UA 1.025    Blood, UA neg    pH, UA 5.0    Protein, UA 100    Urobilinogen, UA 0.2    Nitrite, UA positive    Leukocytes, UA Negative    Will get a urine culture to make sure she does not have a urinary tract infection.  Will treat her blood pressure with resuming her lisinopril HCT and adding amlodipine 5 daily.  We'll recheck her in 5 days to make sure blood pressure is controlled before allowing her to have surgery.  Since copy of this report to her surgeon, Dr. Alvan Dame

## 2014-05-20 ENCOUNTER — Encounter (HOSPITAL_COMMUNITY): Payer: Self-pay

## 2014-05-20 ENCOUNTER — Encounter (HOSPITAL_COMMUNITY)
Admission: RE | Admit: 2014-05-20 | Discharge: 2014-05-20 | Disposition: A | Discharge status: Home / Self Care | Payer: 59 | Source: Ambulatory Visit | Attending: Orthopedic Surgery | Admitting: Orthopedic Surgery

## 2014-05-20 DIAGNOSIS — Z01812 Encounter for preprocedural laboratory examination: Secondary | ICD-10-CM | POA: Diagnosis not present

## 2014-05-20 HISTORY — DX: Headache, unspecified: R51.9

## 2014-05-20 HISTORY — DX: Essential (primary) hypertension: I10

## 2014-05-20 HISTORY — DX: Headache: R51

## 2014-05-20 HISTORY — DX: Pure hypercholesterolemia, unspecified: E78.00

## 2014-05-20 LAB — CBC
HCT: 51.7 % — ABNORMAL HIGH (ref 36.0–46.0)
Hemoglobin: 17.2 g/dL — ABNORMAL HIGH (ref 12.0–15.0)
MCH: 29 pg (ref 26.0–34.0)
MCHC: 33.3 g/dL (ref 30.0–36.0)
MCV: 87 fL (ref 78.0–100.0)
Platelets: 253 10*3/uL (ref 150–400)
RBC: 5.94 MIL/uL — ABNORMAL HIGH (ref 3.87–5.11)
RDW: 13.2 % (ref 11.5–15.5)
WBC: 12.2 10*3/uL — ABNORMAL HIGH (ref 4.0–10.5)

## 2014-05-20 LAB — BASIC METABOLIC PANEL
Anion gap: 9 (ref 5–15)
BUN: 15 mg/dL (ref 6–23)
CO2: 28 mmol/L (ref 19–32)
CREATININE: 0.72 mg/dL (ref 0.50–1.10)
Calcium: 9.8 mg/dL (ref 8.4–10.5)
Chloride: 101 mmol/L (ref 96–112)
GFR calc Af Amer: >90 mL/min (ref >90)
Glucose, Bld: 110 mg/dL — ABNORMAL HIGH (ref 70–99)
POTASSIUM: 4 mmol/L (ref 3.5–5.1)
Sodium: 138 mmol/L (ref 135–145)

## 2014-05-20 LAB — PROTIME-INR
INR: 0.87 (ref 0.00–1.49)
Prothrombin Time: 11.9 seconds (ref 11.6–15.2)

## 2014-05-20 LAB — SURGICAL PCR SCREEN
MRSA, PCR: NEGATIVE
STAPHYLOCOCCUS AUREUS: NEGATIVE

## 2014-05-20 LAB — APTT: aPTT: 29 seconds (ref 24–37)

## 2014-05-20 NOTE — Progress Notes (Addendum)
LOV note 05/19/14 Dr. Alwyn RenHopper on chart, pending clearance note Dr. Alwyn RenHopper on chart, EKG 05/19/14 on EPIC

## 2014-05-20 NOTE — Progress Notes (Signed)
CBC results in epic per PAT visit 05/20/2014 sent to Dr Charlann Boxerlin

## 2014-05-20 NOTE — Patient Instructions (Addendum)
[place]  05/20/2014   Your procedure is scheduled on: Tuesday 06/01/14  [facility] and follow signs to               Short Stay Center at 11:15 AM.  Call this number if you have problems the morning of surgery [phone]   Remember: Please bring inhaler on day of surgery  Do not eat food :After Midnight. Clear liquids from midnight until 08:15 am on 06/01/14 then nothing.      Take these medicines the morning of surgery with A SIP OF WATER: inhaler                               You may not have any metal on your body including hair pins and              piercings  Do not wear jewelry, make-up, lotions, powders or perfumes.             Do not wear nail polish.  Do not shave  48 hours prior to surgery.              Men may shave face and neck.  Do not bring valuables to the hospital. [facility] IS NOT             RESPONSIBLE   FOR VALUABLES.  Contacts, dentures or bridgework may not be worn into surgery.  Leave suitcase in the car. After surgery it may be brought to your room.               Please read over the following fact sheets you were given: MRSA information _____________________________________________________________________           [facility] - Pre[facility] Before surgery, you can play an important role.  Because skin is not sterile, your skin needs to be as free of germs as possible.  You can reduce the number of germs on your skin by washing with CHG (chlorahexidine gluconate) soap before surgery.  CHG is an antiseptic cleaner which kills germs and bonds with the skin to continue killing germs even after washing. Please DO NOT use if you have an allergy to CHG or antibacterial soaps.  If your skin becomes reddened/irritated stop using the CHG and inform your nurse when you arrive at Short Stay. Do not shave (including legs and underarms) for at least 48 hours prior to the first CHG shower.  You may shave  your face/neck. Please follow these instructions carefully:  1.  Shower with CHG Soap the night before surgery and the  morning of Surgery.  2.  If you choose to wash your hair, wash your hair first as usual with your  normal  shampoo.  3.  After you shampoo, rinse your hair and body thoroughly to remove the  shampoo.                            4.  Use CHG as you would any other liquid soap.  You can apply chg directly  to the skin and wash                       Gently with a scrungie or clean washcloth.  5.  Apply the CHG  Soap to your body ONLY FROM THE NECK DOWN.   Do not use on face/ open                           Wound or open sores. Avoid contact with eyes, ears mouth and genitals (private parts).                       Wash face,  Genitals (private parts) with your normal soap.             6.  Wash thoroughly, paying special attention to the area where your surgery  will be performed.  7.  Thoroughly rinse your body with warm water from the neck down.  8.  DO NOT shower/wash with your normal soap after using and rinsing off  the CHG Soap.                9.  Pat yourself dry with a clean towel.            10.  Wear clean pajamas.            11.  Place clean sheets on your bed the night of your first shower and do not  sleep with pets. Day of Surgery : Do not apply any lotions/deodorants the morning of surgery.  Please wear clean clothes to the hospital/surgery center.  FAILURE TO FOLLOW THESE INSTRUCTIONS MAY RESULT IN THE CANCELLATION OF YOUR SURGERY PATIENT SIGNATURE_________________________________  NURSE SIGNATURE__________________________________  ________________________________________________________________________   Adam Phenix  An incentive spirometer is a tool that can help keep your lungs clear and active. This tool measures how well you are filling your lungs with each breath. Taking long deep breaths may help reverse or decrease the chance of developing  breathing (pulmonary) problems (especially infection) following:  A long period of time when you are unable to move or be active. BEFORE THE PROCEDURE   If the spirometer includes an indicator to show your best effort, your nurse or respiratory therapist will set it to a desired goal.  If possible, sit up straight or lean slightly forward. Try not to slouch.  Hold the incentive spirometer in an upright position. INSTRUCTIONS FOR USE  1. Sit on the edge of your bed if possible, or sit up as far as you can in bed or on a chair. 2. Hold the incentive spirometer in an upright position. 3. Breathe out normally. 4. Place the mouthpiece in your mouth and seal your lips tightly around it. 5. Breathe in slowly and as deeply as possible, raising the piston or the ball toward the top of the column. 6. Hold your breath for 3-5 seconds or for as long as possible. Allow the piston or ball to fall to the bottom of the column. 7. Remove the mouthpiece from your mouth and breathe out normally. 8. Rest for a few seconds and repeat Steps 1 through 7 at least 10 times every 1-2 hours when you are awake. Take your time and take a few normal breaths between deep breaths. 9. The spirometer may include an indicator to show your best effort. Use the indicator as a goal to work toward during each repetition. 10. After each set of 10 deep breaths, practice coughing to be sure your lungs are clear. If you have an incision (the cut made at the time of surgery), support your incision when coughing by placing a pillow or rolled up towels  firmly against it. Once you are able to get out of bed, walk around indoors and cough well. You may stop using the incentive spirometer when instructed by your caregiver.  RISKS AND COMPLICATIONS  Take your time so you do not get dizzy or light-headed.  If you are in pain, you may need to take or ask for pain medication before doing incentive spirometry. It is harder to take a deep  breath if you are having pain. AFTER USE  Rest and breathe slowly and easily.  It can be helpful to keep track of a log of your progress. Your caregiver can provide you with a simple table to help with this. If you are using the spirometer at home, follow these instructions: SEEK MEDICAL CARE IF:   You are having difficultly using the spirometer.  You have trouble using the spirometer as often as instructed.  Your pain medication is not giving enough relief while using the spirometer.  You develop fever of 100.5 F (38.1 C) or higher. SEEK IMMEDIATE MEDICAL CARE IF:   You cough up bloody sputum that had not been present before.  You develop fever of 102 F (38.9 C) or greater.  You develop worsening pain at or near the incision site. MAKE SURE YOU:   Understand these instructions.  Will watch your condition.  Will get help right away if you are not doing well or get worse. Document Released: 06/11/2006 Document Revised: 04/23/2011 Document Reviewed: 08/12/2006 ExitCare Patient Information 2014 ExitCare, [place].   ________________________________________________________________________    CLEAR LIQUID DIET   Foods Allowed                                                                     Foods Excluded  Coffee and tea, regular and decaf                             liquids that you cannot  Plain Jell-O in any flavor                                             see through such as: Fruit ices (not with fruit pulp)                                     milk, soups, orange juice  Iced Popsicles                                    All solid food Carbonated beverages, regular and diet                                    Cranberry, grape and apple juices Sports drinks like Gatorade Lightly seasoned clear broth or consume(fat free) Sugar, honey syrup  Sample Menu Breakfast  Lunch                                     Supper Cranberry juice                     Beef broth                            Chicken broth Jell-O                                     Grape juice                           Apple juice Coffee or tea                        Jell-O                                      Popsicle                                                Coffee or tea                        Coffee or tea  _____________________________________________________________________

## 2014-05-20 NOTE — Progress Notes (Addendum)
Pt had PAT appt 05/20/2014. BP readings sitting 213/104 at 9:59 am;rechecked at 11:22 am 184/102. Surgery scheduled for 06/01/2014. Thanks.

## 2014-05-21 ENCOUNTER — Other Ambulatory Visit: Payer: Self-pay | Admitting: Family Medicine

## 2014-05-24 ENCOUNTER — Other Ambulatory Visit: Payer: Self-pay | Admitting: Family Medicine

## 2014-05-24 ENCOUNTER — Ambulatory Visit (INDEPENDENT_AMBULATORY_CARE_PROVIDER_SITE_OTHER): Payer: 59 | Admitting: Family Medicine

## 2014-05-24 VITALS — BP 140/90 | HR 84 | Temp 97.7°F | Resp 18 | Ht 65.0 in | Wt 176.8 lb

## 2014-05-24 DIAGNOSIS — I10 Essential (primary) hypertension: Secondary | ICD-10-CM

## 2014-05-24 NOTE — Progress Notes (Signed)
Subjective: Patient is here for a follow-up with regard to her blood pressure. When I did her preoperative medical examination last week I got her back on her previous medication and added somewhat amlodipine. She is taking both every evening, and is doing okay except for she feels fatigued. She has lost about 3 pounds. She has been cutting back on her smoking, 4 cigarettes a day now.  Objective: Heart regular. Blood pressure good control as noted.  Assessment: Hypertension, improved control Tobacco use disorder  Plan: Stressed the importance of getting off the cigarettes.  Continue taking the blood pressure medication in the same fashion until postop. After surgery I think it would probably be good for her to take the lisinopril every morning and the amlodipine in the evening, but since she is doing well right now I don't want to disrupt anything.  Advise return in about a month postop for a follow-up blood pressure evaluation.  Patient is cleared for surgery.fax a copy of this note to Dr. Charlann Boxerlin.

## 2014-05-24 NOTE — Telephone Encounter (Signed)
Dr Alwyn RenHopper, I don't see that we have Rxd this before, and no record of lipid panel in EPIC. It looks like pt is due for re-check in a few days. Do you want to order a lipid panel then, or want to give RFs now?

## 2014-05-24 NOTE — Patient Instructions (Signed)
Continue current medications.  Postoperatively I would like you to change her schedule and take the lisinopril/HCT in the morning and the amlodipine in the evening  Continue to try and watch your weight and salt intake and cut back on the smoking to the point of getting rid of them completely.  Return in about a month or 6 weeks post op for a follow-up on your blood pressure sometime late May or early June.

## 2014-05-25 NOTE — Progress Notes (Addendum)
OV note per Dr Alwyn RenHopper in epic and on chart 05/24/2014 noting BP issues / clearance noted

## 2014-05-28 NOTE — H&P (Signed)
TOTAL KNEE ADMISSION H&P  Patient is being admitted for left total knee arthroplasty.  Subjective:  Chief Complaint:    Left knee primary OA / pain.  HPI: [place], 59 y.o. female, has a history of pain and functional disability in the left knee due to arthritis and has failed non-surgical conservative treatments for greater than 12 weeks to include NSAID's and/or analgesics, corticosteriod injections, viscosupplementation injections and activity modification.  Onset of symptoms was gradual, starting 2+ years ago with gradually worsening course since that time. The patient noted prior procedures on the knee to include  arthroscopy and menisectomy on the left knee(s).  Patient currently rates pain in the left knee(s) at 10 out of 10 with activity. Patient has night pain, worsening of pain with activity and weight bearing, pain that interferes with activities of daily living, pain with passive range of motion, crepitus and joint swelling.  Patient has evidence of periarticular osteophytes and joint space narrowing by imaging studies.  There is no active infection.  Risks, benefits and expectations were discussed with the patient.  Risks including but not limited to the risk of anesthesia, blood clots, nerve damage, blood vessel damage, failure of the prosthesis, infection and up to and including death.  Patient understand the risks, benefits and expectations and wishes to proceed with surgery.   PCP: Elvera Lennox  D/C Plans:      Home with HHPT  Post-op Meds:       No Rx given   Tranexamic Acid:      To be given - IV   Decadron:      Is to be given  FYI:     ASA post-op  Norco post-op  Nicotine patch 14 mg    Patient Active Problem List   Diagnosis Date Noted  . Finger fracture, left 03/25/2012   Past Medical History  Diagnosis Date  . Allergy   . Arthritis   . Hypertension   . Hypercholesteremia   . Asthma     "mild-no attacks"  . Headache     migraines occasional     Past Surgical History  Procedure Laterality Date  . Meniscus repair  2012  . Tonsillectomy  30's  . Laparoscopy  1992  . Tubal ligation  1987  . Breast surgery Left 1987    biopsy  . Abdominal hysterectomy  1989  . Bladder tact  1997  . Appendectomy  2000  . Cholecystectomy  2000  . Bladder surgery  2005    bladder stretch  . Colonoscopy  2007  . Elbow surgery Left 2003    "damaged funny bone"  . Ankle surgery  2005    No prescriptions prior to admission   Allergies  Allergen Reactions  . Bee Venom Anaphylaxis  . Erythromycin Anaphylaxis and Nausea Only  . Codeine Nausea Only  . Adhesive [Tape] Rash    "From bandaids"     History  Substance Use Topics  . Smoking status: Current Every Day Smoker -- 1.00 packs/day for 43 years    Types: Cigarettes  . Smokeless tobacco: Never Used  . Alcohol Use: Yes     Comment: rare    Family History  Problem Relation Age of Onset  . Cancer Mother   . Cancer Father   . Heart disease Father      Review of Systems  Constitutional: Negative.   Eyes: Negative.   Respiratory: Negative.   Cardiovascular: Negative.   Gastrointestinal: Negative.   Genitourinary: Negative.  Musculoskeletal: Positive for joint pain.  Skin: Negative.   Neurological: Positive for headaches.  Endo/Heme/Allergies: Positive for environmental allergies.  Psychiatric/Behavioral: Negative.     Objective:  Physical Exam  Constitutional: She is oriented to person, place, and time. She appears well-developed and well-nourished.  HENT:  Head: Normocephalic and atraumatic.  Eyes: Pupils are equal, round, and reactive to light.  Neck: Neck supple. No JVD present. No tracheal deviation present. No thyromegaly present.  Cardiovascular: Normal rate, regular rhythm, normal heart sounds and intact distal pulses.   Respiratory: Effort normal and breath sounds normal. No stridor. No respiratory distress. She has no wheezes.  GI: Soft. There is no tenderness.  There is no guarding.  Musculoskeletal:       Left knee: She exhibits decreased range of motion, swelling and bony tenderness. She exhibits no ecchymosis, no deformity, no laceration and no erythema. Tenderness found.  Lymphadenopathy:    She has no cervical adenopathy.  Neurological: She is alert and oriented to person, place, and time.  Skin: Skin is warm and dry.  Psychiatric: She has a normal mood and affect.      Labs:  Estimated body mass index is 29.45 kg/(m^2) as calculated from the following:   Height as of 02/08/14: 5\' 5"  (1.651 m).   Weight as of 02/08/14: 80.287 kg (177 lb).   Imaging Review Plain radiographs demonstrate severe degenerative joint disease of the left knee(s). The overall alignment is neutral. The bone quality appears to be good for age and reported activity level.  Assessment/Plan:  End stage arthritis, left knee   The patient history, physical examination, clinical judgment of the provider and imaging studies are consistent with end stage degenerative joint disease of the left knee(s) and total knee arthroplasty is deemed medically necessary. The treatment options including medical management, injection therapy arthroscopy and arthroplasty were discussed at length. The risks and benefits of total knee arthroplasty were presented and reviewed. The risks due to aseptic loosening, infection, stiffness, patella tracking problems, thromboembolic complications and other imponderables were discussed. The patient acknowledged the explanation, agreed to proceed with the plan and consent was signed. Patient is being admitted for inpatient treatment for surgery, pain control, PT, OT, prophylactic antibiotics, VTE prophylaxis, progressive ambulation and ADL's and discharge planning. The patient is planning to be discharged home with home health services.    Anastasio AuerbachMatthew S. Olivia Pavelko   PA-C  05/28/2014, 3:04 PM

## 2014-05-31 NOTE — Anesthesia Preprocedure Evaluation (Addendum)
Anesthesia Evaluation  Patient identified by MRN, date of birth, ID band Patient awake    Reviewed: Allergy & Precautions, NPO status , Patient's Chart, lab work & pertinent test results, reviewed documented beta blocker date and time   Airway Mallampati: II   Neck ROM: Full    Dental  (+) Teeth Intact, Dental Advisory Given   Pulmonary asthma , Current Smoker (45 pack year),  breath sounds clear to auscultation        Cardiovascular hypertension, Pt. on medications Rhythm:Regular  EKG 05/2014 OK   Neuro/Psych  Headaches,    GI/Hepatic negative GI ROS, Neg liver ROS,   Endo/Other    Renal/GU negative Renal ROS     Musculoskeletal  (+) Arthritis -, Osteoarthritis,    Abdominal (+)  Abdomen: soft.    Peds  Hematology 17/52 INR .89   Anesthesia Other Findings   Reproductive/Obstetrics                           Anesthesia Physical Anesthesia Plan  ASA: III  Anesthesia Plan: Spinal   Post-op Pain Management:    Induction:   Airway Management Planned: Natural Airway  Additional Equipment:   Intra-op Plan:   Post-operative Plan:   Informed Consent: I have reviewed the patients History and Physical, chart, labs and discussed the procedure including the risks, benefits and alternatives for the proposed anesthesia with the patient or authorized representative who has indicated his/her understanding and acceptance.     Plan Discussed with:   Anesthesia Plan Comments:         Anesthesia Quick Evaluation

## 2014-06-01 ENCOUNTER — Inpatient Hospital Stay (HOSPITAL_COMMUNITY): Payer: 59 | Admitting: Anesthesiology

## 2014-06-01 ENCOUNTER — Encounter (HOSPITAL_COMMUNITY)
Admission: RE | Disposition: A | Discharge status: Home / Self Care | Payer: Self-pay | Source: Ambulatory Visit | Attending: Orthopedic Surgery

## 2014-06-01 ENCOUNTER — Inpatient Hospital Stay (HOSPITAL_COMMUNITY)
Admission: RE | Admit: 2014-06-01 | Discharge: 2014-06-03 | DRG: 470 | Disposition: A | Discharge status: Home / Self Care | Payer: 59 | Source: Ambulatory Visit | Attending: Orthopedic Surgery | Admitting: Orthopedic Surgery

## 2014-06-01 ENCOUNTER — Encounter (HOSPITAL_COMMUNITY): Payer: Self-pay | Admitting: *Deleted

## 2014-06-01 DIAGNOSIS — J45909 Unspecified asthma, uncomplicated: Secondary | ICD-10-CM | POA: Diagnosis present

## 2014-06-01 DIAGNOSIS — Z6829 Body mass index (BMI) 29.0-29.9, adult: Secondary | ICD-10-CM

## 2014-06-01 DIAGNOSIS — Z885 Allergy status to narcotic agent status: Secondary | ICD-10-CM

## 2014-06-01 DIAGNOSIS — G43909 Migraine, unspecified, not intractable, without status migrainosus: Secondary | ICD-10-CM | POA: Diagnosis present

## 2014-06-01 DIAGNOSIS — F17210 Nicotine dependence, cigarettes, uncomplicated: Secondary | ICD-10-CM | POA: Diagnosis present

## 2014-06-01 DIAGNOSIS — M659 Synovitis and tenosynovitis, unspecified: Secondary | ICD-10-CM | POA: Diagnosis present

## 2014-06-01 DIAGNOSIS — Z96659 Presence of unspecified artificial knee joint: Secondary | ICD-10-CM

## 2014-06-01 DIAGNOSIS — Z91030 Bee allergy status: Secondary | ICD-10-CM

## 2014-06-01 DIAGNOSIS — M25562 Pain in left knee: Secondary | ICD-10-CM | POA: Diagnosis present

## 2014-06-01 DIAGNOSIS — M1712 Unilateral primary osteoarthritis, left knee: Principal | ICD-10-CM | POA: Diagnosis present

## 2014-06-01 DIAGNOSIS — Z96652 Presence of left artificial knee joint: Secondary | ICD-10-CM

## 2014-06-01 DIAGNOSIS — Z91048 Other nonmedicinal substance allergy status: Secondary | ICD-10-CM | POA: Diagnosis not present

## 2014-06-01 DIAGNOSIS — Z9049 Acquired absence of other specified parts of digestive tract: Secondary | ICD-10-CM | POA: Diagnosis present

## 2014-06-01 DIAGNOSIS — E663 Overweight: Secondary | ICD-10-CM | POA: Diagnosis present

## 2014-06-01 DIAGNOSIS — Z90710 Acquired absence of both cervix and uterus: Secondary | ICD-10-CM

## 2014-06-01 HISTORY — PX: TOTAL KNEE ARTHROPLASTY: SHX125

## 2014-06-01 LAB — TYPE AND SCREEN
ABO/RH(D): A POS
ANTIBODY SCREEN: NEGATIVE

## 2014-06-01 LAB — ABO/RH: ABO/RH(D): A POS

## 2014-06-01 SURGERY — ARTHROPLASTY, KNEE, TOTAL
Anesthesia: Spinal | Site: Knee | Laterality: Left

## 2014-06-01 MED ORDER — MEPERIDINE HCL 50 MG/ML IJ SOLN: 6.2500 mg | INTRAMUSCULAR | Status: DC | PRN

## 2014-06-01 MED ORDER — KETOROLAC TROMETHAMINE 30 MG/ML IJ SOLN
INTRAMUSCULAR | Status: AC
  Filled 2014-06-01: qty 1

## 2014-06-01 MED ORDER — MONTELUKAST SODIUM 10 MG PO TABS
10.0000 mg | ORAL_TABLET | Freq: Every day | ORAL | Status: DC | PRN
  Filled 2014-06-01: qty 1

## 2014-06-01 MED ORDER — CEFAZOLIN SODIUM-DEXTROSE 2-3 GM-% IV SOLR
2.0000 g | INTRAVENOUS | Status: AC
  Administered 2014-06-01: 2 g via INTRAVENOUS

## 2014-06-01 MED ORDER — SODIUM CHLORIDE 0.9 % IJ SOLN
INTRAMUSCULAR | Status: AC
  Filled 2014-06-01: qty 50

## 2014-06-01 MED ORDER — METHOCARBAMOL 1000 MG/10ML IJ SOLN
500.0000 mg | Freq: Four times a day (QID) | INTRAVENOUS | Status: DC | PRN
  Administered 2014-06-01: 500 mg via INTRAVENOUS
  Filled 2014-06-01 (×2): qty 5

## 2014-06-01 MED ORDER — CHLORHEXIDINE GLUCONATE 4 % EX LIQD: 60.0000 mL | Freq: Once | CUTANEOUS | Status: DC

## 2014-06-01 MED ORDER — DOCUSATE SODIUM 100 MG PO CAPS
100.0000 mg | ORAL_CAPSULE | Freq: Two times a day (BID) | ORAL | Status: DC
  Administered 2014-06-01 – 2014-06-03 (×4): 100 mg via ORAL

## 2014-06-01 MED ORDER — DIPHENHYDRAMINE HCL 25 MG PO CAPS
25.0000 mg | ORAL_CAPSULE | Freq: Four times a day (QID) | ORAL | Status: DC | PRN
  Administered 2014-06-03: 25 mg via ORAL
  Filled 2014-06-01: qty 1

## 2014-06-01 MED ORDER — BUPIVACAINE-EPINEPHRINE (PF) 0.25% -1:200000 IJ SOLN
INTRAMUSCULAR | Status: AC
  Filled 2014-06-01: qty 30

## 2014-06-01 MED ORDER — PROPOFOL 10 MG/ML IV BOLUS
INTRAVENOUS | Status: AC
  Filled 2014-06-01: qty 20

## 2014-06-01 MED ORDER — PHENOL 1.4 % MT LIQD
1.0000 | OROMUCOSAL | Status: DC | PRN
  Filled 2014-06-01: qty 177

## 2014-06-01 MED ORDER — METHOCARBAMOL 500 MG PO TABS
500.0000 mg | ORAL_TABLET | Freq: Four times a day (QID) | ORAL | Status: DC | PRN
  Administered 2014-06-02 – 2014-06-03 (×5): 500 mg via ORAL
  Filled 2014-06-01 (×6): qty 1

## 2014-06-01 MED ORDER — DEXAMETHASONE SODIUM PHOSPHATE 10 MG/ML IJ SOLN
10.0000 mg | Freq: Once | INTRAMUSCULAR | Status: AC
  Administered 2014-06-02: 10 mg via INTRAVENOUS
  Filled 2014-06-01: qty 1

## 2014-06-01 MED ORDER — LACTATED RINGERS IV SOLN
INTRAVENOUS | Status: DC
  Administered 2014-06-01 (×3): via INTRAVENOUS
  Administered 2014-06-01: 1000 mL via INTRAVENOUS

## 2014-06-01 MED ORDER — TRANEXAMIC ACID 1000 MG/10ML IV SOLN
1000.0000 mg | Freq: Once | INTRAVENOUS | Status: AC
  Administered 2014-06-01: 1000 mg via INTRAVENOUS
  Filled 2014-06-01: qty 10

## 2014-06-01 MED ORDER — HYDROCODONE-ACETAMINOPHEN 7.5-325 MG PO TABS
1.0000 | ORAL_TABLET | ORAL | Status: DC
  Administered 2014-06-01: 1 via ORAL
  Administered 2014-06-01: 2 via ORAL
  Administered 2014-06-01: 1 via ORAL
  Administered 2014-06-02 – 2014-06-03 (×8): 2 via ORAL
  Filled 2014-06-01: qty 1
  Filled 2014-06-01 (×2): qty 2
  Filled 2014-06-01: qty 1
  Filled 2014-06-01: qty 2
  Filled 2014-06-01: qty 1
  Filled 2014-06-01 (×4): qty 2
  Filled 2014-06-01: qty 1
  Filled 2014-06-01: qty 2

## 2014-06-01 MED ORDER — PROPOFOL 10 MG/ML IV BOLUS
INTRAVENOUS | Status: DC | PRN
  Administered 2014-06-01: 30 mg via INTRAVENOUS

## 2014-06-01 MED ORDER — DEXAMETHASONE SODIUM PHOSPHATE 10 MG/ML IJ SOLN
10.0000 mg | Freq: Once | INTRAMUSCULAR | Status: AC
  Administered 2014-06-01: 10 mg via INTRAVENOUS

## 2014-06-01 MED ORDER — MAGNESIUM CITRATE PO SOLN: 1.0000 | Freq: Once | ORAL | Status: AC | PRN

## 2014-06-01 MED ORDER — FENTANYL CITRATE (PF) 100 MCG/2ML IJ SOLN: 25.0000 ug | INTRAMUSCULAR | Status: DC | PRN

## 2014-06-01 MED ORDER — CEFAZOLIN SODIUM-DEXTROSE 2-3 GM-% IV SOLR
INTRAVENOUS | Status: AC
  Filled 2014-06-01: qty 50

## 2014-06-01 MED ORDER — MENTHOL 3 MG MT LOZG: 1.0000 | LOZENGE | OROMUCOSAL | Status: DC | PRN

## 2014-06-01 MED ORDER — ONDANSETRON HCL 4 MG/2ML IJ SOLN
4.0000 mg | Freq: Four times a day (QID) | INTRAMUSCULAR | Status: DC | PRN
  Administered 2014-06-02: 4 mg via INTRAVENOUS
  Filled 2014-06-01: qty 2

## 2014-06-01 MED ORDER — CELECOXIB 200 MG PO CAPS
200.0000 mg | ORAL_CAPSULE | Freq: Two times a day (BID) | ORAL | Status: DC
  Administered 2014-06-01 – 2014-06-03 (×3): 200 mg via ORAL
  Filled 2014-06-01 (×6): qty 1

## 2014-06-01 MED ORDER — PROMETHAZINE HCL 25 MG/ML IJ SOLN: 6.2500 mg | INTRAMUSCULAR | Status: DC | PRN

## 2014-06-01 MED ORDER — FENTANYL CITRATE (PF) 100 MCG/2ML IJ SOLN
INTRAMUSCULAR | Status: AC
  Filled 2014-06-01: qty 2

## 2014-06-01 MED ORDER — ALUM & MAG HYDROXIDE-SIMETH 200-200-20 MG/5ML PO SUSP: 30.0000 mL | ORAL | Status: DC | PRN

## 2014-06-01 MED ORDER — SODIUM CHLORIDE 0.9 % IJ SOLN
INTRAMUSCULAR | Status: DC | PRN
  Administered 2014-06-01: 20 mL

## 2014-06-01 MED ORDER — 0.9 % SODIUM CHLORIDE (POUR BTL) OPTIME
TOPICAL | Status: DC | PRN
  Administered 2014-06-01: 1000 mL

## 2014-06-01 MED ORDER — KETOROLAC TROMETHAMINE 30 MG/ML IJ SOLN
INTRAMUSCULAR | Status: DC | PRN
  Administered 2014-06-01: 30 mg via INTRA_ARTICULAR

## 2014-06-01 MED ORDER — METOCLOPRAMIDE HCL 5 MG PO TABS
5.0000 mg | ORAL_TABLET | Freq: Three times a day (TID) | ORAL | Status: DC | PRN
  Filled 2014-06-01: qty 2

## 2014-06-01 MED ORDER — HYDROMORPHONE HCL 1 MG/ML IJ SOLN
0.5000 mg | INTRAMUSCULAR | Status: DC | PRN
  Administered 2014-06-02: 0.5 mg via INTRAVENOUS
  Filled 2014-06-01: qty 1

## 2014-06-01 MED ORDER — CEFAZOLIN SODIUM-DEXTROSE 2-3 GM-% IV SOLR
2.0000 g | Freq: Four times a day (QID) | INTRAVENOUS | Status: AC
  Administered 2014-06-01 – 2014-06-02 (×2): 2 g via INTRAVENOUS
  Filled 2014-06-01 (×2): qty 50

## 2014-06-01 MED ORDER — FERROUS SULFATE 325 (65 FE) MG PO TABS
325.0000 mg | ORAL_TABLET | Freq: Three times a day (TID) | ORAL | Status: DC
  Administered 2014-06-03: 325 mg via ORAL
  Filled 2014-06-01 (×8): qty 1

## 2014-06-01 MED ORDER — ESTROGENS CONJUGATED 0.625 MG PO TABS
0.6250 mg | ORAL_TABLET | Freq: Every day | ORAL | Status: DC
  Administered 2014-06-01 – 2014-06-03 (×3): 0.625 mg via ORAL
  Filled 2014-06-01 (×3): qty 1

## 2014-06-01 MED ORDER — NICOTINE 14 MG/24HR TD PT24
14.0000 mg | MEDICATED_PATCH | Freq: Every day | TRANSDERMAL | Status: DC
Start: 2014-06-01 — End: 2014-06-03
  Administered 2014-06-01 – 2014-06-03 (×3): 14 mg via TRANSDERMAL
  Filled 2014-06-01 (×3): qty 1

## 2014-06-01 MED ORDER — ATORVASTATIN CALCIUM 10 MG PO TABS
10.0000 mg | ORAL_TABLET | Freq: Every day | ORAL | Status: DC
  Administered 2014-06-01 – 2014-06-03 (×3): 10 mg via ORAL
  Filled 2014-06-01 (×3): qty 1

## 2014-06-01 MED ORDER — BUPIVACAINE-EPINEPHRINE (PF) 0.25% -1:200000 IJ SOLN
INTRAMUSCULAR | Status: DC | PRN
  Administered 2014-06-01: 30 mL via PERINEURAL

## 2014-06-01 MED ORDER — SODIUM CHLORIDE 0.9 % IR SOLN
Status: DC | PRN
  Administered 2014-06-01: 1000 mL

## 2014-06-01 MED ORDER — ALBUTEROL SULFATE (2.5 MG/3ML) 0.083% IN NEBU: 3.0000 mL | INHALATION_SOLUTION | Freq: Four times a day (QID) | RESPIRATORY_TRACT | Status: DC | PRN

## 2014-06-01 MED ORDER — METOCLOPRAMIDE HCL 5 MG/ML IJ SOLN: 5.0000 mg | Freq: Three times a day (TID) | INTRAMUSCULAR | Status: DC | PRN

## 2014-06-01 MED ORDER — AMLODIPINE BESYLATE 5 MG PO TABS
5.0000 mg | ORAL_TABLET | Freq: Every day | ORAL | Status: DC
  Administered 2014-06-01 – 2014-06-02 (×2): 5 mg via ORAL
  Filled 2014-06-01 (×3): qty 1

## 2014-06-01 MED ORDER — FENTANYL CITRATE (PF) 100 MCG/2ML IJ SOLN
INTRAMUSCULAR | Status: DC | PRN
  Administered 2014-06-01: 50 ug via INTRAVENOUS

## 2014-06-01 MED ORDER — ASPIRIN EC 325 MG PO TBEC
325.0000 mg | DELAYED_RELEASE_TABLET | Freq: Two times a day (BID) | ORAL | Status: DC
  Administered 2014-06-02 – 2014-06-03 (×3): 325 mg via ORAL
  Filled 2014-06-01 (×5): qty 1

## 2014-06-01 MED ORDER — ESTRADIOL 2 MG PO TABS
2.0000 mg | ORAL_TABLET | Freq: Every day | ORAL | Status: DC
  Administered 2014-06-01 – 2014-06-03 (×3): 2 mg via ORAL
  Filled 2014-06-01 (×3): qty 1

## 2014-06-01 MED ORDER — MIDAZOLAM HCL 5 MG/5ML IJ SOLN
INTRAMUSCULAR | Status: DC | PRN
  Administered 2014-06-01: 2 mg via INTRAVENOUS

## 2014-06-01 MED ORDER — SODIUM CHLORIDE 0.9 % IV SOLN
INTRAVENOUS | Status: DC
  Administered 2014-06-01 – 2014-06-02 (×2): via INTRAVENOUS
  Filled 2014-06-01 (×10): qty 1000

## 2014-06-01 MED ORDER — MIDAZOLAM HCL 2 MG/2ML IJ SOLN
INTRAMUSCULAR | Status: AC
  Filled 2014-06-01: qty 2

## 2014-06-01 MED ORDER — PROPOFOL INFUSION 10 MG/ML OPTIME
INTRAVENOUS | Status: DC | PRN
  Administered 2014-06-01: 160 ug/kg/min via INTRAVENOUS

## 2014-06-01 MED ORDER — BISACODYL 10 MG RE SUPP: 10.0000 mg | Freq: Every day | RECTAL | Status: DC | PRN

## 2014-06-01 MED ORDER — POLYETHYLENE GLYCOL 3350 17 G PO PACK
17.0000 g | PACK | Freq: Two times a day (BID) | ORAL | Status: DC
  Administered 2014-06-01 – 2014-06-03 (×4): 17 g via ORAL

## 2014-06-01 MED ORDER — ONDANSETRON HCL 4 MG PO TABS: 4.0000 mg | ORAL_TABLET | Freq: Four times a day (QID) | ORAL | Status: DC | PRN

## 2014-06-01 SURGICAL SUPPLY — 55 items
BAG DECANTER FOR FLEXI CONT (MISCELLANEOUS) IMPLANT
BAG SPEC THK2 15X12 ZIP CLS (MISCELLANEOUS)
BAG ZIPLOCK 12X15 (MISCELLANEOUS) IMPLANT
BANDAGE ELASTIC 6 VELCRO ST LF (GAUZE/BANDAGES/DRESSINGS) ×2 IMPLANT
BANDAGE ESMARK 6X9 LF (GAUZE/BANDAGES/DRESSINGS) ×1 IMPLANT
BLADE SAW SGTL 13.0X1.19X90.0M (BLADE) ×2 IMPLANT
BNDG CMPR 9X6 STRL LF SNTH (GAUZE/BANDAGES/DRESSINGS) ×1
BNDG ESMARK 6X9 LF (GAUZE/BANDAGES/DRESSINGS) ×2
BOWL SMART MIX CTS (DISPOSABLE) ×2 IMPLANT
CAPT KNEE TOTAL 3 ATTUNE ×1 IMPLANT
CEMENT HV SMART SET (Cement) ×2 IMPLANT
CUFF TOURN SGL QUICK 34 (TOURNIQUET CUFF) ×2
CUFF TRNQT CYL 34X4X40X1 (TOURNIQUET CUFF) ×1 IMPLANT
DECANTER SPIKE VIAL GLASS SM (MISCELLANEOUS) ×2 IMPLANT
DRAPE EXTREMITY T 121X128X90 (DRAPE) ×2 IMPLANT
DRAPE POUCH INSTRU U-SHP 10X18 (DRAPES) ×2 IMPLANT
DRAPE U-SHAPE 47X51 STRL (DRAPES) ×2 IMPLANT
DRSG AQUACEL AG ADV 3.5X10 (GAUZE/BANDAGES/DRESSINGS) ×2 IMPLANT
DURAPREP 26ML APPLICATOR (WOUND CARE) ×4 IMPLANT
ELECT REM PT RETURN 9FT ADLT (ELECTROSURGICAL) ×2
ELECTRODE REM PT RTRN 9FT ADLT (ELECTROSURGICAL) ×1 IMPLANT
FACESHIELD WRAPAROUND (MASK) ×12 IMPLANT
FACESHIELD WRAPAROUND OR TEAM (MASK) ×5 IMPLANT
GLOVE BIOGEL PI IND STRL 7.5 (GLOVE) ×1 IMPLANT
GLOVE BIOGEL PI IND STRL 8.5 (GLOVE) ×1 IMPLANT
GLOVE BIOGEL PI INDICATOR 7.5 (GLOVE) ×1
GLOVE BIOGEL PI INDICATOR 8.5 (GLOVE) ×1
GLOVE ECLIPSE 8.0 STRL XLNG CF (GLOVE) ×2 IMPLANT
GLOVE ORTHO TXT STRL SZ7.5 (GLOVE) ×4 IMPLANT
GOWN SPEC L3 XXLG W/TWL (GOWN DISPOSABLE) ×2 IMPLANT
GOWN STRL REUS W/TWL LRG LVL3 (GOWN DISPOSABLE) ×2 IMPLANT
HANDPIECE INTERPULSE COAX TIP (DISPOSABLE) ×2
KIT BASIN OR (CUSTOM PROCEDURE TRAY) ×2 IMPLANT
LIQUID BAND (GAUZE/BANDAGES/DRESSINGS) ×2 IMPLANT
MANIFOLD NEPTUNE II (INSTRUMENTS) ×2 IMPLANT
NDL SAFETY ECLIPSE 18X1.5 (NEEDLE) ×1 IMPLANT
NEEDLE HYPO 18GX1.5 SHARP (NEEDLE) ×2
PACK TOTAL JOINT (CUSTOM PROCEDURE TRAY) ×2 IMPLANT
PEN SKIN MARKING BROAD (MISCELLANEOUS) ×2 IMPLANT
POSITIONER SURGICAL ARM (MISCELLANEOUS) ×2 IMPLANT
SET HNDPC FAN SPRY TIP SCT (DISPOSABLE) ×1 IMPLANT
SET PAD KNEE POSITIONER (MISCELLANEOUS) ×2 IMPLANT
SUCTION FRAZIER 12FR DISP (SUCTIONS) ×2 IMPLANT
SUT MNCRL AB 4-0 PS2 18 (SUTURE) ×2 IMPLANT
SUT VIC AB 1 CT1 36 (SUTURE) ×2 IMPLANT
SUT VIC AB 2-0 CT1 27 (SUTURE) ×6
SUT VIC AB 2-0 CT1 TAPERPNT 27 (SUTURE) ×3 IMPLANT
SUT VLOC 180 0 24IN GS25 (SUTURE) ×2 IMPLANT
SYR 50ML LL SCALE MARK (SYRINGE) ×2 IMPLANT
TOWEL OR 17X26 10 PK STRL BLUE (TOWEL DISPOSABLE) ×2 IMPLANT
TOWEL OR NON WOVEN STRL DISP B (DISPOSABLE) ×1 IMPLANT
TRAY FOLEY W/METER SILVER 14FR (SET/KITS/TRAYS/PACK) ×2 IMPLANT
WATER STERILE IRR 1500ML POUR (IV SOLUTION) ×2 IMPLANT
WRAP KNEE MAXI GEL POST OP (GAUZE/BANDAGES/DRESSINGS) ×2 IMPLANT
YANKAUER SUCT BULB TIP 10FT TU (MISCELLANEOUS) ×2 IMPLANT

## 2014-06-01 NOTE — Anesthesia Postprocedure Evaluation (Signed)
  Anesthesia Post-op Note  Patient: Mercedes CrockerVirginia L Lucas  Procedure(s) Performed: Procedure(s): LEFT TOTAL KNEE ARTHROPLASTY (Left)  Patient Location: PACU  Anesthesia Type:Spinal  Level of Consciousness: awake and alert   Airway and Oxygen Therapy: Patient Spontanous Breathing and Patient connected to nasal cannula oxygen  Post-op Pain: mild  Post-op Assessment: Post-op Vital signs reviewed and Patient's Cardiovascular Status Stable  Post-op Vital Signs: Reviewed and stable  Last Vitals:  Filed Vitals:   06/01/14 1548  BP: 126/78  Pulse: 80  Temp: 36.3 C  Resp: 15    Complications: No apparent anesthesia complications

## 2014-06-01 NOTE — Discharge Instructions (Signed)

## 2014-06-01 NOTE — Interval H&P Note (Signed)
History and Physical Interval Note:  06/01/2014 12:37 PM  Mercedes Lucas  has presented today for surgery, with the diagnosis of LEFT KNEE OA  The various methods of treatment have been discussed with the patient and family. After consideration of risks, benefits and other options for treatment, the patient has consented to  Procedure(s): LEFT TOTAL KNEE ARTHROPLASTY (Left) as a surgical intervention .  The patient's history has been reviewed, patient examined, no change in status, stable for surgery.  I have reviewed the patient's chart and labs.  Questions were answered to the patient's satisfaction.     Shelda PalLIN,Shirlee Whitmire D

## 2014-06-01 NOTE — Transfer of Care (Signed)
Immediate Anesthesia Transfer of Care Note  Patient: Mercedes CrockerVirginia L Hamric  Procedure(s) Performed: Procedure(s): LEFT TOTAL KNEE ARTHROPLASTY (Left)  Patient Location: PACU  Anesthesia Type:Regional and Spinal  Level of Consciousness: awake, sedated and patient cooperative  Airway & Oxygen Therapy: Patient Spontanous Breathing and Patient connected to face mask oxygen  Post-op Assessment: Report given to RN and Post -op Vital signs reviewed and stable  Post vital signs: Reviewed and stable  Last Vitals:  Filed Vitals:   06/01/14 1109  BP: 140/90  Pulse: 96  Temp: 36.3 C  Resp: 18    Complications: No apparent anesthesia complications

## 2014-06-01 NOTE — Anesthesia Procedure Notes (Signed)
Spinal  Start time: 06/01/2014 2:13 PM End time: 06/01/2014 2:21 PM Staffing Anesthesiologist: Sebastian AcheMANNY, Pink Maye Performed by: anesthesiologist  Spinal Block Patient position: sitting Prep: Betadine and site prepped and draped Patient monitoring: heart rate, cardiac monitor and blood pressure Approach: midline Location: L3-4 Injection technique: single-shot Needle Needle gauge: 24 G Needle length: 10 cm Needle insertion depth: 6 cm Additional Notes 15mg  marcaine with dextrose, no complications

## 2014-06-01 NOTE — Op Note (Signed)
NAME:  Mercedes Lucas                      MEDICAL RECORD NO.:  [id number]                             FACILITY:  [facility]      PHYSICIAN:  Madlyn Frankel. Charlann Boxer, M.D.  DATE OF BIRTH:  [birth year]      DATE OF PROCEDURE:  06/01/2014                                     OPERATIVE REPORT         PREOPERATIVE DIAGNOSIS:  Left knee osteoarthritis.      POSTOPERATIVE DIAGNOSIS:  Left knee osteoarthritis.      FINDINGS:  The patient was noted to have complete loss of cartilage and   bone-on-bone arthritis with associated osteophytes in the medial and patellofemoral compartments of   the knee with a significant synovitis and associated effusion.      PROCEDURE:  Left total knee replacement.      COMPONENTS USED:  DePuy Attune rotating platform posterior stabilized knee   system, a size 6 femur, 5 tibia, 5 mm PS AOX insert, and 38 patellar   button.      SURGEON:  Madlyn Frankel. Charlann Boxer, M.D.      ASSISTANT:  Lanney Gins, PA-C.      ANESTHESIA:  Spinal.      SPECIMENS:  None.      COMPLICATION:  None.      DRAINS:  none.  EBL: <50      TOURNIQUET TIME:   Total Tourniquet Time Documented: Thigh (Left) - 34 minutes Total: Thigh (Left) - 34 minutes  .      The patient was stable to the recovery room.      INDICATION FOR PROCEDURE:  Mercedes Lucas is a 59 y.o. female patient of   mine.  The patient had been seen, evaluated, and treated conservatively in the   office with medication, activity modification, and injections.  The patient had   radiographic changes of bone-on-bone arthritis with endplate sclerosis and osteophytes noted.      The patient failed conservative measures including medication, injections, and activity modification, and at this point was ready for more definitive measures.   Based on the radiographic changes and failed conservative measures, the patient   decided to proceed with total knee replacement.  Risks of infection,   DVT, component failure, need for  revision surgery, postop course, and   expectations were all   discussed and reviewed.  Consent was obtained for benefit of pain   relief.      PROCEDURE IN DETAIL:  The patient was brought to the operative theater.   Once adequate anesthesia, preoperative antibiotics, 2 gm of Ancef, 1 gm of Tranexamic Acid, and  of Decadron administered, the patient was positioned supine with the left thigh tourniquet placed.  The  left lower extremity was prepped and draped in sterile fashion.  A time-   out was performed identifying the patient, planned procedure, and   extremity.      The left lower extremity was placed in the [facility] leg holder.  The leg was   exsanguinated, tourniquet elevated to 250 mmHg.  A midline incision was   made followed by median  parapatellar arthrotomy.  Following initial   exposure, attention was first directed to the patella.  Precut   measurement was noted to be 25 mm.  I resected down to 14 mm and used a   38 anatomic patellar button to restore patellar height as well as cover the cut   surface.      The lug holes were drilled and a metal shim was placed to protect the   patella from retractors and saw blades.      At this point, attention was now directed to the femur.  The femoral   canal was opened with a drill, irrigated to try to prevent fat emboli.  An   intramedullary rod was passed at 3 degrees valgus, 9 mm of bone was   resected off the distal femur.  Following this resection, the tibia was   subluxated anteriorly.  Using the extramedullary guide, 2 mm of bone was resected off   the proximal medial tibia.  We confirmed the gap would be   stable medially and laterally with a size 5 mm insert as well as confirmed   the cut was perpendicular in the coronal plane, checking with an alignment rod.      Once this was done, I sized the femur to be a size 6 in the anterior-   posterior dimension, chose a narrow component based on medial and   lateral dimension.   The size 6 rotation block was then pinned in   position anterior referenced using the C-clamp to set rotation.  The   anterior, posterior, and  chamfer cuts were made without difficulty nor   notching making certain that I was along the anterior cortex to help   with flexion gap stability.      The final box cut was made off the lateral aspect of distal femur.      At this point, the tibia was sized to be a size 5, the size 5 tray was   then pinned in position through the medial third of the tubercle,   drilled, and keel punched.  Trial reduction was now carried with a 6 femur,  5 tibia, a 5 mm insert, and the 38 patella botton.  The knee was brought to   extension, full extension with good flexion stability with the patella   tracking through the trochlea without application of pressure.  Given   all these findings, the trial components removed.  Final components were   opened and cement was mixed.  The knee was irrigated with normal saline   solution and pulse lavage.  The synovial lining was   then injected with 30cc of 0.25% Marcaine with epinephrine and 1 cc of Toradol plus 30cc of NS for a total of 61 cc.      The knee was irrigated.  Final implants were then cemented onto clean and   dried cut surfaces of bone with the knee brought to extension with a size 5 mm trial insert.      Once the cement had fully cured, the excess cement was removed   throughout the knee.  I confirmed I was satisfied with the range of   motion and stability, and the final size 5 mm PS AOX insert was chosen.  It was   placed into the knee.      The tourniquet had been let down at 34 minutes.  No significant   hemostasis required.  The   extensor mechanism was then reapproximated  using #1 Vicryl and #0 V-lock sutures with the knee   in flexion.  The   remaining wound was closed with 2-0 Vicryl and running 4-0 Monocryl.   The knee was cleaned, dried, dressed sterilely using Dermabond and   Aquacel  dressing.  The patient was then   brought to recovery room in stable condition, tolerating the procedure   well.   Please note that Physician Assistant, Lanney GinsMatthew Babish, PA-C, was present for the entirety of the case, and was utilized for pre-operative positioning, peri-operative retractor management, general facilitation of the procedure.  He was also utilized for primary wound closure at the end of the case.              Madlyn FrankelMatthew D. Charlann Boxerlin, M.D.    06/01/2014 3:21 PM

## 2014-06-02 DIAGNOSIS — E663 Overweight: Secondary | ICD-10-CM | POA: Diagnosis present

## 2014-06-02 LAB — BASIC METABOLIC PANEL
Anion gap: 6 (ref 5–15)
BUN: 15 mg/dL (ref 6–23)
CALCIUM: 9 mg/dL (ref 8.4–10.5)
CO2: 26 mmol/L (ref 19–32)
CREATININE: 0.63 mg/dL (ref 0.50–1.10)
Chloride: 106 mmol/L (ref 96–112)
GFR calc Af Amer: >90 mL/min (ref >90)
GLUCOSE: 192 mg/dL — AB (ref 70–99)
Potassium: 4.4 mmol/L (ref 3.5–5.1)
Sodium: 138 mmol/L (ref 135–145)

## 2014-06-02 LAB — CBC
HEMATOCRIT: 40.7 % (ref 36.0–46.0)
HEMOGLOBIN: 12.9 g/dL (ref 12.0–15.0)
MCH: 28.2 pg (ref 26.0–34.0)
MCHC: 31.7 g/dL (ref 30.0–36.0)
MCV: 88.9 fL (ref 78.0–100.0)
PLATELETS: 263 10*3/uL (ref 150–400)
RBC: 4.58 MIL/uL (ref 3.87–5.11)
RDW: 13.1 % (ref 11.5–15.5)
WBC: 22.2 10*3/uL — ABNORMAL HIGH (ref 4.0–10.5)

## 2014-06-02 MED ORDER — HYDROCODONE-ACETAMINOPHEN 7.5-325 MG PO TABS: 1.0000 | ORAL_TABLET | ORAL | Status: DC | PRN

## 2014-06-02 MED ORDER — POLYETHYLENE GLYCOL 3350 17 G PO PACK: 17.0000 g | PACK | Freq: Two times a day (BID) | ORAL | Status: DC

## 2014-06-02 MED ORDER — FERROUS SULFATE 325 (65 FE) MG PO TABS: 325.0000 mg | ORAL_TABLET | Freq: Three times a day (TID) | ORAL | Status: DC

## 2014-06-02 MED ORDER — ASPIRIN 325 MG PO TBEC: 325.0000 mg | DELAYED_RELEASE_TABLET | Freq: Two times a day (BID) | ORAL | Status: AC

## 2014-06-02 MED ORDER — METHOCARBAMOL 500 MG PO TABS: 500.0000 mg | ORAL_TABLET | Freq: Four times a day (QID) | ORAL | Status: DC | PRN

## 2014-06-02 MED ORDER — FLUCONAZOLE 150 MG PO TABS
150.0000 mg | ORAL_TABLET | Freq: Once | ORAL | Status: AC
  Administered 2014-06-02: 150 mg via ORAL
  Filled 2014-06-02: qty 1

## 2014-06-02 MED ORDER — DOCUSATE SODIUM 100 MG PO CAPS: 100.0000 mg | ORAL_CAPSULE | Freq: Two times a day (BID) | ORAL | Status: DC

## 2014-06-02 NOTE — Progress Notes (Signed)
     Subjective: 1 Day Post-Op Procedure(s) (LRB): LEFT TOTAL KNEE ARTHROPLASTY (Left)   Patient reports pain as mild, pain controlled. No events throughout the night.    Objective:   VITALS:   Filed Vitals:   06/02/14 0523  BP: 144/81  Pulse: 63  Temp: 97.7 F (36.5 C)  Resp: 16    Dorsiflexion/Plantar flexion intact Incision: dressing C/D/I No cellulitis present Compartment soft  LABS  Recent Labs  06/02/14 0500  HGB 12.9  HCT 40.7  WBC 22.2*  PLT 263     Recent Labs  06/02/14 0500  NA 138  K 4.4  BUN 15  CREATININE 0.63  GLUCOSE 192*     Assessment/Plan: 1 Day Post-Op Procedure(s) (LRB): LEFT TOTAL KNEE ARTHROPLASTY (Left) Foley cath d/c'ed Advance diet Up with therapy D/C IV fluids Discharge home with home health eventually, probably tomorrow  Overweight (BMI 25-29.9) Estimated body mass index is 29.29 kg/(m^2) as calculated from the following:   Height as of this encounter: 5\' 5"  (1.651 m).   Weight as of this encounter: 79.833 kg (176 lb). Patient also counseled that weight may inhibit the healing process Patient counseled that losing weight will help with future health issues       Mercedes AuerbachMatthew S. Nyla Lucas   PAC  06/02/2014, 9:03 AM

## 2014-06-02 NOTE — Progress Notes (Signed)
OT Cancellation Note  Patient Details Name: Mercedes Lucas MRN: [id number] DOB: 11/02/1955   Cancelled Treatment:    Reason Eval/Treat Not Completed: Other (comment) per PT, pt with nausea after up with PT and requesting to rest this am. Will try back later today or tomorrow am.  Lennox LaityStone, Blease Capaldi Stafford  [phone] 06/02/2014, 10:52 AM

## 2014-06-02 NOTE — Progress Notes (Signed)
Physical Therapy Treatment Patient Details Name: Mercedes CrockerVirginia L Wetherington MRN: [id number] DOB: 10/13/1955 Today's Date: 06/02/2014    History of Present Illness L TKR    PT Comments    Pt progressing well with mobility and hopeful for dc tomorrow.  Follow Up Recommendations  Home health PT     Equipment Recommendations  Rolling walker with 5" wheels    Recommendations for Other Services OT consult     Precautions / Restrictions Precautions Precautions: Knee;Fall Restrictions Weight Bearing Restrictions: No Other Position/Activity Restrictions: WBAT    Mobility  Bed Mobility Overal bed mobility: Needs Assistance Bed Mobility: Supine to Sit     Supine to sit: Min guard     General bed mobility comments: cues for sequence and use of UEs to self assist  Transfers Overall transfer level: Needs assistance Equipment used: Rolling walker (2 wheeled) Transfers: Sit to/from Stand Sit to Stand: Min guard         General transfer comment: cues for LE management and use of UEs to self assist  Ambulation/Gait Ambulation/Gait assistance: Min guard Ambulation Distance (Feet): 150 Feet Assistive device: Rolling walker (2 wheeled) Gait Pattern/deviations: Step-to pattern;Decreased step length - right;Decreased step length - left;Shuffle;Trunk flexed Gait velocity: decr   General Gait Details: Cues for sequence, posture and position from [employer]   Stairs            Wheelchair Mobility    Modified Rankin (Stroke Patients Only)       Balance                                    Cognition Arousal/Alertness: Awake/alert Behavior During Therapy: WFL for tasks assessed/performed Overall Cognitive Status: Within Functional Limits for tasks assessed                      Exercises      General Comments        Pertinent Vitals/Pain Pain Assessment: 0-10 Pain Score: 6  Pain Location: L knee Pain Descriptors / Indicators: Aching;Sore Pain  Intervention(s): Limited activity within patient's tolerance;Monitored during session;Premedicated before session;Ice applied    Home Living                      Prior Function            PT Goals (current goals can now be found in the care plan section) Acute Rehab PT Goals Patient Stated Goal: Resume previous lifestyle with decreased pain PT Goal Formulation: With patient Time For Goal Achievement: 06/05/14 Potential to Achieve Goals: Good Progress towards PT goals: Progressing toward goals    Frequency  7X/week    PT Plan Current plan remains appropriate    Co-evaluation             End of Session Equipment Utilized During Treatment: Gait belt Activity Tolerance: Patient tolerated treatment well Patient left: in chair;with call bell/phone within reach;with family/visitor present     Time: [id number] PT Time Calculation (min) (ACUTE ONLY): 30 min  Charges:  $Gait Training: 23-37 mins                    G Codes:      Latanga Nedrow 06/02/2014, 4:30 PM

## 2014-06-02 NOTE — Evaluation (Signed)
Physical Therapy Evaluation Patient Details Name: Mercedes Lucas MRN: [id number] DOB: 02/06/1956 Today's Date: 06/02/2014   History of Present Illness  L TKR  Clinical Impression  Pt s/p L TKR presents with decreased L LE strength/ROM and post op pain limiting functional mobility.  Pt should progress well to dc home with family assist and HHPT follow up.    Follow Up Recommendations Home health PT    Equipment Recommendations  Rolling walker with 5" wheels    Recommendations for Other Services OT consult     Precautions / Restrictions Precautions Precautions: Knee;Fall Restrictions Weight Bearing Restrictions: No Other Position/Activity Restrictions: WBAT      Mobility  Bed Mobility Overal bed mobility: Needs Assistance Bed Mobility: Supine to Sit;Sit to Supine     Supine to sit: Min guard Sit to supine: Min guard   General bed mobility comments: cues for sequence and use of UEs to self assist  Transfers Overall transfer level: Needs assistance Equipment used: Rolling walker (2 wheeled) Transfers: Sit to/from Stand Sit to Stand: Min guard         General transfer comment: cues for LE management and use of UEs to self assist  Ambulation/Gait Ambulation/Gait assistance: Min assist;Min guard Ambulation Distance (Feet): 100 Feet Assistive device: Rolling walker (2 wheeled) Gait Pattern/deviations: Step-to pattern;Step-through pattern;Decreased step length - right;Decreased step length - left;Shuffle;Trunk flexed     General Gait Details: Cues for sequence, posture and position from [employer]  Stairs            Wheelchair Mobility    Modified Rankin (Stroke Patients Only)       Balance                                             Pertinent Vitals/Pain Pain Assessment: 0-10 Pain Score: 8  Pain Location: L knee Pain Descriptors / Indicators: Aching;Sore Pain Intervention(s): Limited activity within patient's tolerance;Monitored  during session;Premedicated before session;Ice applied    Home Living Family/patient expects to be discharged to:: Private residence Living Arrangements: Spouse/significant other Available Help at Discharge: Family Type of Home: House Home Access: Stairs to enter   Secretary/administratorntrance Stairs-Number of Steps: 1 Home Layout: One level Home Equipment: Cane - single point;Shower seat;Bedside commode;Toilet riser;Crutches      Prior Function Level of Independence: Independent               Hand Dominance        Extremity/Trunk Assessment   Upper Extremity Assessment: Overall WFL for tasks assessed           Lower Extremity Assessment: LLE deficits/detail   LLE Deficits / Details: 3/5 quads with AAROM at knee -10- 110  Cervical / Trunk Assessment: Normal  Communication   Communication: No difficulties  Cognition Arousal/Alertness: Awake/alert Behavior During Therapy: WFL for tasks assessed/performed Overall Cognitive Status: Within Functional Limits for tasks assessed                      General Comments      Exercises Total Joint Exercises Ankle Circles/Pumps: AROM;Both;Supine;20 reps Quad Sets: AROM;Both;Supine;15 reps Heel Slides: AAROM;Left;15 reps;Supine Straight Leg Raises: AAROM;AROM;Left;15 reps;Supine      Assessment/Plan    PT Assessment Patient needs continued PT services  PT Diagnosis Difficulty walking   PT Problem List Decreased strength;Decreased range of motion;Decreased activity tolerance;Decreased mobility;Decreased knowledge of  use of DME;Pain  PT Treatment Interventions DME instruction;Gait training;Stair training;Functional mobility training;Therapeutic activities;Patient/family education   PT Goals (Current goals can be found in the Care Plan section) Acute Rehab PT Goals Patient Stated Goal: Resume previous lifestyle with decreased pain PT Goal Formulation: With patient Time For Goal Achievement: 06/05/14 Potential to Achieve  Goals: Good    Frequency 7X/week   Barriers to discharge        Co-evaluation               End of Session Equipment Utilized During Treatment: Gait belt Activity Tolerance: Patient tolerated treatment well Patient left: with call bell/phone within reach;in bed;with family/visitor present Nurse Communication: Mobility status         Time: 1610-9604 PT Time Calculation (min) (ACUTE ONLY): 47 min   Charges:   PT Evaluation $Initial PT Evaluation Tier I: 1 Procedure PT Treatments $Gait Training: 8-22 mins $Therapeutic Exercise: 8-22 mins   PT G Codes:        Mercedes Lucas 06/14/14, 12:18 PM

## 2014-06-02 NOTE — Care Management Note (Signed)
CARE MANAGEMENT NOTE 06/02/2014  Patient:  Mercedes Lucas,Mercedes Lucas   Account Number:  [id number]  Date Initiated:  06/02/2014  Documentation initiated by:  DAVIS,RHONDA  Subjective/Objective Assessment:   left total knee replacement     Action/Plan:   home when stable   Anticipated DC Date:  06/04/2014   Anticipated DC Plan:  HOME W HOME HEALTH SERVICES  In-house referral  NA      DC Planning Services  CM consult      [facility]  [facility]   Choice offered to / List presented to:  C-1 Patient   DME arranged  BEDSIDE COMMODE  [facility] - [facility]      DME agency  [facility]     HH arranged  HH-2 PT      [facility]   Status of service:  In process, will continue to follow Medicare Important Message given?   (If response is "NO", the following Medicare IM given date fields will be blank) Date Medicare IM given:   Medicare IM given by:   Date Additional Medicare IM given:   Additional Medicare IM given by:    Discharge Disposition:    Per UR Regulation:  Reviewed for med. necessity/level of care/duration of stay  If discussed at Long Length of Stay Meetings, dates discussed:    Comments:  June 02, 2014/Rhonda Lucas. Earlene Plateravis, RN, BSN, CCM. Case Management [employer] [phone] No discharge needs present of time of review.

## 2014-06-03 LAB — CBC
HEMATOCRIT: 36.6 % (ref 36.0–46.0)
Hemoglobin: 11.6 g/dL — ABNORMAL LOW (ref 12.0–15.0)
MCH: 28.2 pg (ref 26.0–34.0)
MCHC: 31.7 g/dL (ref 30.0–36.0)
MCV: 88.8 fL (ref 78.0–100.0)
Platelets: 242 10*3/uL (ref 150–400)
RBC: 4.12 MIL/uL (ref 3.87–5.11)
RDW: 13.1 % (ref 11.5–15.5)
WBC: 21.7 10*3/uL — AB (ref 4.0–10.5)

## 2014-06-03 LAB — BASIC METABOLIC PANEL
Anion gap: 6 (ref 5–15)
BUN: 24 mg/dL — ABNORMAL HIGH (ref 6–23)
CO2: 26 mmol/L (ref 19–32)
Calcium: 8.9 mg/dL (ref 8.4–10.5)
Chloride: 108 mmol/L (ref 96–112)
Creatinine, Ser: 0.65 mg/dL (ref 0.50–1.10)
GFR calc non Af Amer: >90 mL/min (ref >90)
Glucose, Bld: 180 mg/dL — ABNORMAL HIGH (ref 70–99)
POTASSIUM: 4.5 mmol/L (ref 3.5–5.1)
SODIUM: 140 mmol/L (ref 135–145)

## 2014-06-03 NOTE — Evaluation (Signed)
Occupational Therapy One Time Evaluation Patient Details Name: Mercedes CrockerVirginia L Lucas MRN: [id number] DOB: 12/21/1955 Today's Date: 06/03/2014    History of Present Illness L TKR   Clinical Impression   Pt doing well and ready to d/c today. Some cues to slow down for safety as pt tends to move quickly and talk fast. Husband present for education and both verbalize understanding of all education. No further OT needs.     Follow Up Recommendations  No OT follow up;Supervision/Assistance - 24 hour    Equipment Recommendations  None recommended by OT    Recommendations for Other Services       Precautions / Restrictions Precautions Precautions: Knee;Fall Restrictions Weight Bearing Restrictions: No Other Position/Activity Restrictions: WBAT      Mobility Bed Mobility               General bed mobility comments: in chair  Transfers Overall transfer level: Needs assistance Equipment used: Rolling walker (2 wheeled) Transfers: Sit to/from Stand Sit to Stand: Min guard         General transfer comment: cues for hand placement.    Balance                                            ADL Overall ADL's : Needs assistance/impaired Eating/Feeding: Independent;Sitting   Grooming: Wash/dry hands;Min guard;Standing   Upper Body Bathing: Set up;Sitting   Lower Body Bathing: Min guard;Sit to/from stand   Upper Body Dressing : Set up;Sitting   Lower Body Dressing: Min guard;Sit to/from stand   Toilet Transfer: Min guard;Ambulation;BSC;RW   Toileting- ArchitectClothing Manipulation and Hygiene: Min guard;Sit to/from stand         General ADL Comments: Demonstrated tub transfer with using a tubbench and the safe technique. Pt and husband verbalize understanding and decline need to practice. [facility] and needed min cues for not twisting L knee donning underwear. Pt up to the bathroom to 3in1 with walker and did have some dizziness when she turned too  quickly. Stood for several seconds and able to recover. Educated on slower turns for safety and not picking up walker.      Vision     Perception     Praxis      Pertinent Vitals/Pain Pain Assessment: 0-10 Pain Score: 8  Pain Location: L knee Pain Descriptors / Indicators: Aching Pain Intervention(s): Repositioned     Hand Dominance     Extremity/Trunk Assessment Upper Extremity Assessment Upper Extremity Assessment: Overall WFL for tasks assessed           Communication Communication Communication: No difficulties   Cognition Arousal/Alertness: Awake/alert Behavior During Therapy: WFL for tasks assessed/performed Overall Cognitive Status: Within Functional Limits for tasks assessed                     General Comments       Exercises       Shoulder Instructions      Home Living Family/patient expects to be discharged to:: Private residence Living Arrangements: Spouse/significant other Available Help at Discharge: Family Type of Home: House Home Access: Stairs to enter Secretary/administratorntrance Stairs-Number of Steps: 1   Home Layout: One level     Bathroom Shower/Tub: Chief Strategy OfficerTub/shower unit   Bathroom Toilet: Standard     Home Equipment: Cane - single point;Bedside commode;Toilet riser;Crutches;Tub bench  Prior Functioning/Environment Level of Independence: Independent             OT Diagnosis: Generalized weakness   OT Problem List:     OT Treatment/Interventions:      OT Goals(Current goals can be found in the care plan section) Acute Rehab OT Goals Patient Stated Goal: return to independence.  OT Goal Formulation: With patient  OT Frequency:     Barriers to D/C:            Co-evaluation              End of Session Equipment Utilized During Treatment: Rolling walker  Activity Tolerance: Patient tolerated treatment well Patient left: in chair;with call bell/phone within reach;with family/visitor present   Time:  1610-9604 OT Time Calculation (min): 24 min Charges:  OT General Charges $OT Visit: 1 Procedure OT Evaluation $Initial OT Evaluation Tier I: 1 Procedure OT Treatments $Therapeutic Activity: 8-22 mins G-Codes:    Lennox Laity  [phone] 06/03/2014, 10:24 AM

## 2014-06-03 NOTE — Progress Notes (Signed)
     Subjective: 2 Days Post-Op Procedure(s) (LRB): LEFT TOTAL KNEE ARTHROPLASTY (Left)   Patient reports pain as mild, pain controlled. Patient very excited to recover and wants to be very compliant in everything she is suppose to be doing. No events throughout the night. Ready to be discharged home.  Objective:   VITALS:   Filed Vitals:   06/02/14 2014  BP: 148/84  Pulse: 57  Temp: 97.6 F (36.4 C)  Resp: 16    Dorsiflexion/Plantar flexion intact Incision: dressing C/D/I No cellulitis present Compartment soft  LABS  Recent Labs  06/02/14 0500 06/03/14 0402  HGB 12.9 11.6*  HCT 40.7 36.6  WBC 22.2* 21.7*  PLT 263 242     Recent Labs  06/02/14 0500 06/03/14 0402  NA 138 134*  K 4.4 4.3  BUN 15 24*  CREATININE 0.63 0.65  GLUCOSE 192* 180*     Assessment/Plan: 2 Days Post-Op Procedure(s) (LRB): LEFT TOTAL KNEE ARTHROPLASTY (Left) Up with therapy Discharge home with home health  [facility]. Follow up with OLIN,Tamme Mozingo D in 2 weeks.  Contact information:  [facility] [address] [place] [id number] [phone]        Anastasio AuerbachMatthew S. Imanol Bihl   PAC  06/03/2014, 7:45 AM

## 2014-06-03 NOTE — Progress Notes (Signed)
Physical Therapy Treatment Patient DetailsPt  Name: Mercedes Lucas MRN: [id number] DOB: 1955-04-09 Today's Date: 06/03/2014    History of Present Illness L TKR    PT Comments    Pt progressing well and eager for dc  Follow Up Recommendations  Home health PT     Equipment Recommendations  Rolling walker with 5" wheels    Recommendations for Other Services OT consult     Precautions / Restrictions Precautions Precautions: Knee;Fall Restrictions Weight Bearing Restrictions: No Other Position/Activity Restrictions: WBAT    Mobility  Bed Mobility Overal bed mobility: Needs Assistance Bed Mobility: Supine to Sit     Supine to sit: Supervision     General bed mobility comments: in chair  Transfers Overall transfer level: Needs assistance Equipment used: Rolling walker (2 wheeled) Transfers: Sit to/from Stand Sit to Stand: Min guard         General transfer comment: cues for hand placement.  Ambulation/Gait Ambulation/Gait assistance: Min guard;Supervision Ambulation Distance (Feet): 200 Feet Assistive device: Rolling walker (2 wheeled) Gait Pattern/deviations: Step-to pattern;Step-through pattern;Decreased step length - right;Decreased step length - left;Shuffle Gait velocity: decr   General Gait Details: Cues for sequence, posture and position from RW   Stairs Stairs: Yes Stairs assistance: Min assist Stair Management: No rails;Forwards;With walker;Step to pattern;Backwards Number of Stairs: 3 General stair comments: Single step twice fwd and once bkwd with RW and cues for sequence and foot/RW placement  Wheelchair Mobility    Modified Rankin (Stroke Patients Only)       Balance                                    Cognition Arousal/Alertness: Awake/alert Behavior During Therapy: WFL for tasks assessed/performed Overall Cognitive Status: Within Functional Limits for tasks assessed                      Exercises  Total Joint Exercises Ankle Circles/Pumps: AROM;Both;Supine;20 reps Quad Sets: AROM;Both;Supine;15 reps Heel Slides: AAROM;Left;Supine;20 reps Straight Leg Raises: AAROM;AROM;Left;Supine;20 reps Goniometric ROM: AAROM at L knee -10 - 120    General Comments        Pertinent Vitals/Pain Pain Assessment: 0-10 Pain Score: 8  Pain Location: L knee Pain Descriptors / Indicators: Aching Pain Intervention(s): Repositioned    Home Living Family/patient expects to be discharged to:: Private residence Living Arrangements: Spouse/significant other Available Help at Discharge: Family Type of Home: House Home Access: Stairs to enter   Home Layout: One level Home Equipment: Cane - single point;Bedside commode;Toilet riser;Crutches;Tub bench      Prior Function Level of Independence: Independent          PT Goals (current goals can now be found in the care plan section) Acute Rehab PT Goals Patient Stated Goal: return to independence.  PT Goal Formulation: With patient Time For Goal Achievement: 06/05/14 Potential to Achieve Goals: Good Progress towards PT goals: Progressing toward goals    Frequency  7X/week    PT Plan Current plan remains appropriate    Co-evaluation             End of Session Equipment Utilized During Treatment: Gait belt Activity Tolerance: Patient tolerated treatment well Patient left: in chair;with call bell/phone within reach;with family/visitor present     Time: 4098-1191 PT Time Calculation (min) (ACUTE ONLY): 40 min  Charges:  $Gait Training: 8-22 mins $Therapeutic Exercise: 8-22 mins $Therapeutic Activity: 8-22 mins  G Codes:      Mercedes Lucas 06/03/2014, 12:50 PM

## 2014-06-07 NOTE — Discharge Summary (Signed)
Physician Discharge Summary  Patient ID: Mercedes Lucas MRN: [id number] DOB/AGE: 61/06/1955 61 y.o.  Admit date: 06/01/2014 Discharge date: 06/03/2014   Procedures:  Procedure(s) (LRB): LEFT TOTAL KNEE ARTHROPLASTY (Left)  Attending Physician:  Dr. Durene RomansMatthew Olin   Admission Diagnoses:   Left knee primary OA / pain  Discharge Diagnoses:  Principal Problem:   S/P left TKA Active Problems:   S/P knee replacement   Overweight (BMI 25.0-29.9)  Past Medical History  Diagnosis Date  . Allergy   . Arthritis   . Hypertension   . Hypercholesteremia   . Asthma     "mild-no attacks"  . Headache     migraines occasional    HPI:    Mercedes Lucas, 59 y.o. female, has a history of pain and functional disability in the left knee due to arthritis and has failed non-surgical conservative treatments for greater than 12 weeks to include NSAID's and/or analgesics, corticosteriod injections, viscosupplementation injections and activity modification. Onset of symptoms was gradual, starting 2+ years ago with gradually worsening course since that time. The patient noted prior procedures on the knee to include arthroscopy and menisectomy on the left knee(s). Patient currently rates pain in the left knee(s) at 10 out of 10 with activity. Patient has night pain, worsening of pain with activity and weight bearing, pain that interferes with activities of daily living, pain with passive range of motion, crepitus and joint swelling. Patient has evidence of periarticular osteophytes and joint space narrowing by imaging studies. There is no active infection. Risks, benefits and expectations were discussed with the patient. Risks including but not limited to the risk of anesthesia, blood clots, nerve damage, blood vessel damage, failure of the prosthesis, infection and up to and including death. Patient understand the risks, benefits and expectations and wishes to proceed with surgery.  PCP: Elvera LennoxScott  Gurley   Discharged Condition: good  Hospital Course:  Patient underwent the above stated procedure on 06/01/2014. Patient tolerated the procedure well and brought to the recovery room in good condition and subsequently to the floor.  POD #1 BP: 144/81 ; Pulse: 63 ; Temp: 97.7 F (36.5 C) ; Resp: 16 Patient reports pain as mild, pain controlled. No events throughout the night.  Dorsiflexion/plantar flexion intact, incision: dressing C/D/I, no cellulitis present and compartment soft.   LABS  Basename    HGB  12.9  HCT  40.7   POD #2  BP: 148/84 ; Pulse: 57 ; Temp: 97.6 F (36.4 C) ; Resp: 16 Patient reports pain as mild, pain controlled. Patient very excited to recover and wants to be very compliant in everything she is suppose to be doing. No events throughout the night. Ready to be discharged home. Dorsiflexion/plantar flexion intact, incision: dressing C/D/I, no cellulitis present and compartment soft.   LABS  Basename    HGB  11.6  HCT  36.6    Discharge Exam: General appearance: alert, cooperative and no distress Extremities: Homans sign is negative, no sign of DVT, no edema, redness or tenderness in the calves or thighs and no ulcers, gangrene or trophic changes  Disposition: Home with follow up in 2 weeks   Follow-up Information    Follow up with Shelda PalLIN,Ajiah Mcglinn D, MD. Schedule an appointment as soon as possible for a visit in 2 weeks.   Specialty:  Orthopedic Surgery   Contact information:   [address] [place] [id number] [phone]       [facility]  Contact information:   [address] [place] [address] [phone]       Discharge Instructions    Call MD / Call 911    Complete by:  As directed   If you experience chest pain or shortness of breath, CALL 911 and be transported to the hospital emergency room.  If you develope a fever above 101 F, pus (white drainage) or increased drainage or  redness at the wound, or calf pain, call your surgeon's office.     Change dressing    Complete by:  As directed   Maintain surgical dressing until follow up in the clinic. If the edges start to pull up, may reinforce with tape. If the dressing is no longer working, may remove and cover with gauze and tape, but must keep the area dry and clean.  Call with any questions or concerns.     Constipation Prevention    Complete by:  As directed   Drink plenty of fluids.  Prune juice may be helpful.  You may use a stool softener, such as Colace (over the counter) 100 mg twice a day.  Use MiraLax (over the counter) for constipation as needed.     Diet - low sodium heart healthy    Complete by:  As directed      Discharge instructions    Complete by:  As directed   Maintain surgical dressing until follow up in the clinic. If the edges start to pull up, may reinforce with tape. If the dressing is no longer working, may remove and cover with gauze and tape, but must keep the area dry and clean.  Follow up in 2 weeks at [facility]. Call with any questions or concerns.     Increase activity slowly as tolerated    Complete by:  As directed      TED hose    Complete by:  As directed   Use stockings (TED hose) for 2 weeks on both leg(s).  You may remove them at night for sleeping.     Weight bearing as tolerated    Complete by:  As directed   Laterality:  left  Extremity:  Lower             Medication List    STOP taking these medications        Acetaminophen-Caffeine 500-65 MG Tabs     aspirin-acetaminophen-caffeine 250-250-65 MG per tablet  Commonly known as:  EXCEDRIN MIGRAINE     traMADol 50 MG tablet  Commonly known as:  ULTRAM      TAKE these medications        albuterol 108 (90 BASE) MCG/ACT inhaler  Commonly known as:  PROVENTIL HFA;VENTOLIN HFA  Inhale 1-2 puffs into the lungs every 6 (six) hours as needed for wheezing.     amLODipine 5 MG tablet  Commonly known  as:  NORVASC  Take 1 tablet (5 mg total) by mouth daily.     aspirin 325 MG EC tablet  Take 1 tablet (325 mg total) by mouth 2 (two) times daily.     atorvastatin 10 MG tablet  Commonly known as:  LIPITOR  Take 10 mg by mouth daily.     atorvastatin 40 MG tablet  Commonly known as:  LIPITOR  TAKE 1 TABLET BY MOUTH EVERY DAY(NEED APPOINTMENT FOR ADDITIONAL REFILLS)     celecoxib 200 MG capsule  Commonly known as:  CELEBREX  Take 200 mg by mouth daily as  needed for mild pain.     docusate sodium 100 MG capsule  Commonly known as:  COLACE  Take 1 capsule (100 mg total) by mouth 2 (two) times daily.     estradiol 2 MG tablet  Commonly known as:  ESTRACE  Take 2 mg by mouth daily.     estrogens (conjugated) 0.625 MG tablet  Commonly known as:  PREMARIN  Take 0.625 mg by mouth daily. Take daily for 21 days then do not take for 7 days.     ferrous sulfate 325 (65 FE) MG tablet  Take 1 tablet (325 mg total) by mouth 3 (three) times daily after meals.     Fluticasone-Salmeterol 100-50 MCG/DOSE Aepb  Commonly known as:  ADVAIR  Inhale 1 puff into the lungs daily as needed (Shortness of breath).     HYDROcodone-acetaminophen 7.5-325 MG per tablet  Commonly known as:  NORCO  Take 1-2 tablets by mouth every 4 (four) hours as needed for moderate pain.     lisinopril-hydrochlorothiazide 20-12.5 MG per tablet  Commonly known as:  PRINZIDE,ZESTORETIC  Take 1 tablet by mouth daily.     methocarbamol 500 MG tablet  Commonly known as:  ROBAXIN  Take 1 tablet (500 mg total) by mouth every 6 (six) hours as needed for muscle spasms.     montelukast 10 MG tablet  Commonly known as:  SINGULAIR  Take 1 tablet (10 mg total) by mouth at bedtime.     polyethylene glycol packet  Commonly known as:  MIRALAX / GLYCOLAX  Take 17 g by mouth 2 (two) times daily.         Signed: Anastasio Auerbach. Keilon Ressel   PA-C  06/07/2014, 9:29 AM

## 2014-09-08 ENCOUNTER — Other Ambulatory Visit: Payer: Self-pay | Admitting: Family Medicine

## 2014-11-05 ENCOUNTER — Other Ambulatory Visit: Payer: Self-pay | Admitting: Family Medicine

## 2014-11-06 ENCOUNTER — Other Ambulatory Visit: Payer: Self-pay | Admitting: Physician Assistant

## 2014-11-12 ENCOUNTER — Other Ambulatory Visit: Payer: Self-pay | Admitting: Family Medicine

## 2014-11-14 ENCOUNTER — Other Ambulatory Visit: Payer: Self-pay | Admitting: Physician Assistant

## 2014-12-04 ENCOUNTER — Other Ambulatory Visit: Payer: Self-pay | Admitting: Urgent Care

## 2014-12-04 ENCOUNTER — Other Ambulatory Visit: Payer: Self-pay | Admitting: Physician Assistant

## 2014-12-06 ENCOUNTER — Other Ambulatory Visit: Payer: Self-pay | Admitting: Urgent Care

## 2014-12-13 ENCOUNTER — Telehealth: Payer: Self-pay

## 2014-12-13 NOTE — Telephone Encounter (Signed)
Patient scheduled an appointment for 11/11 and would like to know if she could get enough of her estrogen medication to last until next appointment. Patient states she's in the [place] having heat flashes. [facility] [address] Patient phone: [phone]

## 2014-12-14 ENCOUNTER — Telehealth: Payer: Self-pay

## 2014-12-14 NOTE — Telephone Encounter (Signed)
PATIENT RETURNED TISHIRA'S PHONE CALL. SHE IS TAKING ESTRADIOL 2 MG 1 TIME A DAY. SHE DOES NOT HAVE CANCER BUT SHE IS A SMOKER. CYNTHIA WHITE AT [facility] IS WHO PRESCRIBED THIS MEDICINE FOR HER BEFORE. SHE NO LONGER GOES THERE. BEST PHONE [phone] (CELL) MBC

## 2014-12-14 NOTE — Telephone Encounter (Signed)
Left vmail for patient to call back so that I can gather more information. How much estrace is she using and for how long? Is she a smoker or ever had cancer? Does not appear that rx has ever been filled by any of the providers in this practice. Who was previous prescriber?

## 2014-12-15 MED ORDER — ESTRADIOL 2 MG PO TABS: 2.0000 mg | ORAL_TABLET | Freq: Every day | ORAL | Status: DC

## 2014-12-15 NOTE — Addendum Note (Signed)
Addended by: Hinton RaoBREWINGTON, Brenae Lasecki R on: 12/15/2014 10:24 AM   Modules accepted: Orders

## 2014-12-15 NOTE — Telephone Encounter (Signed)
Called and spoke with patient. Informed her that prescription has been called in to pharmacy and to keep future appts. Pt verbalized understanding.

## 2014-12-24 ENCOUNTER — Encounter: Payer: Self-pay | Admitting: Physician Assistant

## 2014-12-24 ENCOUNTER — Other Ambulatory Visit: Payer: Self-pay | Admitting: Physician Assistant

## 2014-12-24 ENCOUNTER — Ambulatory Visit (INDEPENDENT_AMBULATORY_CARE_PROVIDER_SITE_OTHER): Payer: 59 | Admitting: Physician Assistant

## 2014-12-24 VITALS — BP 158/90 | HR 65 | Temp 98.5°F | Resp 16 | Ht 65.5 in | Wt 170.8 lb

## 2014-12-24 DIAGNOSIS — Z96652 Presence of left artificial knee joint: Secondary | ICD-10-CM

## 2014-12-24 DIAGNOSIS — Z91048 Other nonmedicinal substance allergy status: Secondary | ICD-10-CM

## 2014-12-24 DIAGNOSIS — Z23 Encounter for immunization: Secondary | ICD-10-CM

## 2014-12-24 DIAGNOSIS — N951 Menopausal and female climacteric states: Secondary | ICD-10-CM | POA: Diagnosis not present

## 2014-12-24 DIAGNOSIS — I10 Essential (primary) hypertension: Secondary | ICD-10-CM

## 2014-12-24 DIAGNOSIS — Z720 Tobacco use: Secondary | ICD-10-CM

## 2014-12-24 DIAGNOSIS — R232 Flushing: Secondary | ICD-10-CM

## 2014-12-24 DIAGNOSIS — J4520 Mild intermittent asthma, uncomplicated: Secondary | ICD-10-CM

## 2014-12-24 DIAGNOSIS — E785 Hyperlipidemia, unspecified: Secondary | ICD-10-CM | POA: Diagnosis not present

## 2014-12-24 DIAGNOSIS — Z9109 Other allergy status, other than to drugs and biological substances: Secondary | ICD-10-CM

## 2014-12-24 MED ORDER — FLUTICASONE-SALMETEROL 100-50 MCG/DOSE IN AEPB: 1.0000 | INHALATION_SPRAY | Freq: Every day | RESPIRATORY_TRACT | Status: DC | PRN

## 2014-12-24 MED ORDER — LISINOPRIL-HYDROCHLOROTHIAZIDE 20-25 MG PO TABS: 1.0000 | ORAL_TABLET | Freq: Every day | ORAL | Status: DC

## 2014-12-24 MED ORDER — MONTELUKAST SODIUM 10 MG PO TABS: ORAL_TABLET | ORAL | Status: DC

## 2014-12-24 MED ORDER — NICOTINE 14 MG/24HR TD PT24: 14.0000 mg | MEDICATED_PATCH | Freq: Every day | TRANSDERMAL | Status: DC

## 2014-12-24 MED ORDER — ESTRADIOL 1 MG PO TABS: 1.0000 mg | ORAL_TABLET | Freq: Every day | ORAL | Status: DC

## 2014-12-24 MED ORDER — ALBUTEROL SULFATE HFA 108 (90 BASE) MCG/ACT IN AERS: 1.0000 | INHALATION_SPRAY | Freq: Four times a day (QID) | RESPIRATORY_TRACT | Status: DC | PRN

## 2014-12-24 MED ORDER — ATORVASTATIN CALCIUM 40 MG PO TABS: ORAL_TABLET | ORAL | Status: DC

## 2014-12-24 MED ORDER — AMLODIPINE BESYLATE 5 MG PO TABS: 5.0000 mg | ORAL_TABLET | Freq: Every day | ORAL | Status: DC

## 2014-12-24 NOTE — Progress Notes (Signed)
[facility] [address] [place] [address] [phone]  Date:  12/24/2014   Name:  Mercedes Lucas   DOB:  1955-07-30   MRN:  [id number]  PCP:  Elvera Lennox    Chief Complaint: Establish Care and Medication Refill   History of Present Illness:  This is a 59 y.o. female with PMH HTN, HLD, mild intermittent asthma, menopause on HRT and tobacco abuse who is presenting to establish care.  HTN: She takes amlodipine 5 mg and lisinopril-hctz 20-12.5 mg. She takes her pills at night before bed. She struggles with taking her meds at a consistent time. She works for the [employer]. She goes to bed around 10 pm and wakes around 2 am. She is often confused about whether to take her meds in the morning or at night as she has heard conflicting this from different providers. She has a BP monitor at home and checks occ. She states her SBP is usually in the 150s.  Menopause: Pt went into surgical menopause around age 83 with a total hysterectomy and salpingo-oophorectomy. She has been on estrace ever since. She has smoked cigarettes off and on during this time and she states no one has ever said she shouldn't smoke on estrogen. She has never had a blood clot before. She states she does have problems with hot flashes and feels they have actually been getting worse in the past 1 year.  Asthma: She has mild intermittent asthma. She uses albuterol once every couple months. She has advair on hand to start taking if she gets a viral cough to prevent from becoming bronchitis.  HLD: take lipitor 40 mg QD.  Tobacco abuse: 1 pack every 3 days. Had stopped for 10 years but started back 1 year ago. Every time she stops, she stops cold [place]. She tried a nicotine patch once before and it helped.  Doing well on diet. Trying to eat healthy and lose weight.  She recently had a right knee replacement by Dr. Charlann Boxer. She is recovering well. Dr. Charlann Boxer prescribes celebrex.  Takes singulair for env  allergies.  She does not want a mammogram ever again. She does not want a colonoscopy.  Review of Systems:  Review of Systems See HPI  Patient Active Problem List   Diagnosis Date Noted  . Overweight (BMI 25.0-29.9) 06/02/2014  . S/P left TKA 06/01/2014  . S/P knee replacement 06/01/2014  . Finger fracture, left 03/25/2012    Prior to Admission medications   Medication Sig Start Date End Date Taking? Authorizing Provider  albuterol (PROVENTIL HFA;VENTOLIN HFA) 108 (90 BASE) MCG/ACT inhaler Inhale 1-2 puffs into the lungs every 6 (six) hours as needed for wheezing. 11/07/12  Yes Reuben Likes, MD  amLODipine (NORVASC) 5 MG tablet Take 1 tablet (5 mg total) by mouth daily. Patient taking differently: Take 5 mg by mouth at bedtime.  05/19/14  Yes Peyton Najjar, MD  atorvastatin (LIPITOR) 40 MG tablet TAKE 1 TABLET BY MOUTH EVERY DAY  "OV NEEDED FOR ADDITIONAL REFILLS" 11/14/14  Yes Chelle Jeffery, PA-C         estradiol (ESTRACE) 2 MG tablet Take 1 tablet (2 mg total) by mouth daily. 12/15/14  Yes Tishira R Brewington, PA-C         Fluticasone-Salmeterol (ADVAIR) 100-50 MCG/DOSE AEPB Inhale 1 puff into the lungs daily as needed (Shortness of breath).   Yes Historical Provider, MD  HYDROcodone-acetaminophen (NORCO) 7.5-325 MG per tablet Take 1-2 tablets by  mouth every 4 (four) hours as needed for moderate pain. 06/02/14  Yes Matthew Babish, PA-C  lisinopril-hydrochlorothiazide (PRINZIDE,ZESTORETIC) 20-12.5 MG tablet TAKE 1 TABLET BY MOUTH EVERY DAY  "OFFICE VISIT NEEDED FOR REFILLS" 12/05/14  Yes Morrell Riddle, PA-C  celecoxib (CELEBREX) 200 MG capsule Take 200 mg by mouth daily as needed for mild pain.    yes Historical Provider, MD                montelukast (SINGULAIR) 10 MG tablet TAKE 1 TABLET BY MOUTH EVERY NIGHT AT BEDTIME. PATIENT NEEDS OFFICE VISIT FOR ADDITIONAL REFILLS Patient not taking: Reported on 12/24/2014 12/06/14  yes Wallis Bamberg, PA-C           Allergies  Allergen  Reactions  . Bee Venom Anaphylaxis  . Erythromycin Anaphylaxis and Nausea Only  . Codeine Nausea Only  . Adhesive [Tape] Rash    "From bandaids"     Past Surgical History  Procedure Laterality Date  . Meniscus repair  2012  . Tonsillectomy  30's  . Laparoscopy  1992  . Tubal ligation  1987  . Breast surgery Left 1987    biopsy  . Abdominal hysterectomy  1989  . Bladder tact  1997  . Appendectomy  2000  . Cholecystectomy  2000  . Bladder surgery  2005    bladder stretch  . Colonoscopy  2007  . Elbow surgery Left 2003    "damaged funny bone"  . Ankle surgery  2005  . Total knee arthroplasty Left 06/01/2014    Procedure: LEFT TOTAL KNEE ARTHROPLASTY;  Surgeon: Durene Romans, MD;  Location: [facility];  Service: Orthopedics;  Laterality: Left;    Social History  Substance Use Topics  . Smoking status: Current Every Day Smoker -- 1.00 packs/day for 43 years    Types: Cigarettes  . Smokeless tobacco: Never Used  . Alcohol Use: Yes     Comment: rare    Family History  Problem Relation Age of Onset  . Cancer Mother   . Cancer Father   . Heart disease Father     Medication list has been reviewed and updated.  Physical Examination:  Physical Exam  Constitutional: She is oriented to person, place, and time. She appears well-developed and well-nourished. No distress.  HENT:  Head: Normocephalic and atraumatic.  Right Ear: Hearing normal.  Left Ear: Hearing normal.  Nose: Nose normal.  Mouth/Throat: Uvula is midline, oropharynx is clear and moist and mucous membranes are normal.  Eyes: Conjunctivae and lids are normal. Right eye exhibits no discharge. Left eye exhibits no discharge. No scleral icterus.  Neck: Trachea normal. Carotid bruit is not present. No thyromegaly present.  Cardiovascular: Normal rate, regular rhythm, normal heart sounds and normal pulses.   No murmur heard. Pulmonary/Chest: Effort normal and breath sounds normal. No respiratory distress. She has no  wheezes. She has no rhonchi. She has no rales.  Musculoskeletal: Normal range of motion.  Surgical scar over left anterior knee. Mild right knee swelling. Good ROM.   Lymphadenopathy:    She has no cervical adenopathy.  Neurological: She is alert and oriented to person, place, and time.  Skin: Skin is warm, dry and intact. No lesion and no rash noted.  No LE edema  Psychiatric: She has a normal mood and affect. Her speech is normal and behavior is normal. Thought content normal.   BP 158/90 mmHg  Pulse 65  Temp(Src) 98.5 F (36.9 C) (Oral)  Resp 16  Ht 5' 5.5" (  1.664 m)  Wt 170 lb 12.8 oz (77.474 kg)  BMI 27.98 kg/m2  Assessment and Plan:  1. Essential hypertension BP uncontrolled. Will increase her hctz to 25 mg. Keep lisinopril at 20 mg and amlodipine at 5 mg. CBC and CMP pending. Advised she take all meds in the morning when she wakes to increase med compliance. - CBC with Differential/Platelet; Future - COMPLETE METABOLIC PANEL WITH GFR; Future - amLODipine (NORVASC) 5 MG tablet; Take 1 tablet (5 mg total) by mouth daily.  Dispense: 90 tablet; Refill: 1 - lisinopril-hydrochlorothiazide (PRINZIDE,ZESTORETIC) 20-25 MG tablet; Take 1 tablet by mouth daily.  Dispense: 90 tablet; Refill: 1  2. Hyperlipidemia lipitor refilled. Lipid panel pending. - Lipid panel; Future - atorvastatin (LIPITOR) 40 MG tablet; TAKE 1 TABLET BY MOUTH EVERY DAY  "OV NEEDED FOR ADDITIONAL REFILLS"  Dispense: 30 tablet; Refill: 0  3. Asthma, mild intermittent, uncomplicated Inhalers refilled. Albuterol as needed. Advair for URIs to prevent asthma complication. - albuterol (PROVENTIL HFA;VENTOLIN HFA) 108 (90 BASE) MCG/ACT inhaler; Inhale 1-2 puffs into the lungs every 6 (six) hours as needed for wheezing.  Dispense: 1 Inhaler; Refill: 0 - Fluticasone-Salmeterol (ADVAIR) 100-50 MCG/DOSE AEPB; Inhale 1 puff into the lungs daily as needed (Shortness of breath).  Dispense: 60 each; Refill: 3  4. Hot  flashes Advised she start cutting back on the dose of her estrace. She will start taking 1.5 mg for next 4 weeks. Then will get down to 1 mg for next 4 weeks. She will continue to cut back by 0.5 mg every 4 weeks with goal of stopping completely. I also strongly suggested she stop smoking cigarettes and if she does not, I will not continue to fill. Pt does not want another mammogram, but we discussed in future if she continues to stay on estrace, she will need a mammogram as estrace puts her at greater risk for breast cancer. - estradiol (ESTRACE) 1 MG tablet; Take 1 tablet (1 mg total) by mouth daily.  Dispense: 30 tablet; Refill: 5  5. Environmental allergies singulair refilled. - montelukast (SINGULAIR) 10 MG tablet; TAKE 1 TABLET BY MOUTH EVERY NIGHT AT BEDTIME. PATIENT NEEDS OFFICE VISIT FOR ADDITIONAL REFILLS  Dispense: 30 tablet; Refill: 1  6. Tobacco abuse We had long discussion about cessation. See above about hot flashes and estrace. Prescribed nicoderm patches to help. - nicotine (NICODERM CQ - DOSED IN MG/24 HOURS) 14 mg/24hr patch; Place 1 patch (14 mg total) onto the skin daily.  Dispense: 28 patch; Refill: 0  7. Need for prophylactic vaccination and inoculation against influenza - Flu Vaccine QUAD 36+ mos IM  8. S/P left TKA Follow up with Dr. Charlann Boxerlin.  Return in 3 months for follow up.   Roswell MinersNicole V. Dyke BrackettBush, PA-C, MHS [facility]  12/27/2014

## 2014-12-24 NOTE — Patient Instructions (Addendum)
Take 1.5 tabs estradiol for the next 4 weeks. Then cut down to 1 tab for the next 4 weeks. Continue to cut back every 4 weeks unless you have symptoms that you cannot handle. QUIT SMOKING!! I will not continue to prescribe estradiol if you do not stop smoking Try the patches. Start taking new blood pressure pill. Take all pills in the morning with breakfast. Monitor your blood pressure at home, ideally should be <140/90. Return for blood work. Return in 3 months for follow up.

## 2014-12-27 DIAGNOSIS — Z9109 Other allergy status, other than to drugs and biological substances: Secondary | ICD-10-CM | POA: Insufficient documentation

## 2014-12-27 DIAGNOSIS — E785 Hyperlipidemia, unspecified: Secondary | ICD-10-CM | POA: Insufficient documentation

## 2014-12-27 DIAGNOSIS — R232 Flushing: Secondary | ICD-10-CM | POA: Insufficient documentation

## 2014-12-27 DIAGNOSIS — Z720 Tobacco use: Secondary | ICD-10-CM | POA: Insufficient documentation

## 2014-12-27 DIAGNOSIS — I10 Essential (primary) hypertension: Secondary | ICD-10-CM | POA: Insufficient documentation

## 2014-12-27 DIAGNOSIS — J4520 Mild intermittent asthma, uncomplicated: Secondary | ICD-10-CM | POA: Insufficient documentation

## 2014-12-30 ENCOUNTER — Encounter: Payer: Self-pay | Admitting: Physician Assistant

## 2014-12-30 ENCOUNTER — Other Ambulatory Visit (INDEPENDENT_AMBULATORY_CARE_PROVIDER_SITE_OTHER): Payer: Commercial Managed Care - HMO

## 2014-12-30 ENCOUNTER — Other Ambulatory Visit: Payer: Self-pay | Admitting: Physician Assistant

## 2014-12-30 DIAGNOSIS — I10 Essential (primary) hypertension: Secondary | ICD-10-CM

## 2014-12-30 DIAGNOSIS — E785 Hyperlipidemia, unspecified: Secondary | ICD-10-CM

## 2014-12-30 LAB — CBC WITH DIFFERENTIAL/PLATELET
BASOS PCT: 1 % (ref 0–1)
Basophils Absolute: 0.1 10*3/uL (ref 0.0–0.1)
EOS PCT: 4 % (ref 0–5)
Eosinophils Absolute: 0.4 10*3/uL (ref 0.0–0.7)
HEMATOCRIT: 43.9 % (ref 36.0–46.0)
HEMOGLOBIN: 14.6 g/dL (ref 12.0–15.0)
Lymphocytes Relative: 40 % (ref 12–46)
Lymphs Abs: 4.5 10*3/uL — ABNORMAL HIGH (ref 0.7–4.0)
MCH: 28.9 pg (ref 26.0–34.0)
MCHC: 33.3 g/dL (ref 30.0–36.0)
MCV: 86.8 fL (ref 78.0–100.0)
MONO ABS: 0.4 10*3/uL (ref 0.1–1.0)
MPV: 11.9 fL (ref 8.6–12.4)
Monocytes Relative: 4 % (ref 3–12)
Neutro Abs: 5.7 10*3/uL (ref 1.7–7.7)
Neutrophils Relative %: 51 % (ref 43–77)
Platelets: 260 10*3/uL (ref 150–400)
RBC: 5.06 MIL/uL (ref 3.87–5.11)
RDW: 12.9 % (ref 11.5–15.5)
WBC: 11.2 10*3/uL — AB (ref 4.0–10.5)

## 2014-12-30 LAB — COMPLETE METABOLIC PANEL WITH GFR
ALBUMIN: 4.1 g/dL (ref 3.6–5.1)
ALK PHOS: 78 U/L (ref 33–130)
ALT: 24 U/L (ref 6–29)
AST: 16 U/L (ref 10–35)
BILIRUBIN TOTAL: 0.7 mg/dL (ref 0.2–1.2)
BUN: 18 mg/dL (ref 7–25)
CALCIUM: 9.5 mg/dL (ref 8.6–10.4)
CO2: 25 mmol/L (ref 20–31)
Chloride: 105 mmol/L (ref 98–110)
Creat: 0.55 mg/dL (ref 0.50–0.99)
GFR, Est African American: >89 mL/min (ref >=60)
GFR, Est Non African American: >89 mL/min (ref >=60)
GLUCOSE: 98 mg/dL (ref 65–99)
POTASSIUM: 4.5 mmol/L (ref 3.5–5.3)
SODIUM: 140 mmol/L (ref 135–146)
TOTAL PROTEIN: 6.4 g/dL (ref 6.1–8.1)

## 2014-12-30 LAB — LIPID PANEL
CHOL/HDL RATIO: 3.4 ratio (ref <=5.0)
CHOLESTEROL: 164 mg/dL (ref 125–200)
HDL: 48 mg/dL (ref >=46)
LDL Cholesterol: 93 mg/dL (ref <130)
Triglycerides: 116 mg/dL (ref <150)
VLDL: 23 mg/dL (ref <30)

## 2015-01-19 ENCOUNTER — Other Ambulatory Visit: Payer: Self-pay | Admitting: Physician Assistant

## 2015-02-17 ENCOUNTER — Other Ambulatory Visit: Payer: Self-pay | Admitting: Physician Assistant

## 2015-03-16 ENCOUNTER — Other Ambulatory Visit: Payer: Self-pay | Admitting: Physician Assistant

## 2015-05-11 ENCOUNTER — Other Ambulatory Visit: Payer: Self-pay | Admitting: Family Medicine

## 2015-06-17 ENCOUNTER — Other Ambulatory Visit: Payer: Self-pay | Admitting: Physician Assistant

## 2015-07-17 ENCOUNTER — Other Ambulatory Visit: Payer: Self-pay | Admitting: Physician Assistant

## 2015-07-29 ENCOUNTER — Other Ambulatory Visit: Payer: Self-pay | Admitting: Physician Assistant

## 2015-07-30 ENCOUNTER — Other Ambulatory Visit: Payer: Self-pay | Admitting: Physician Assistant

## 2015-08-12 ENCOUNTER — Telehealth: Payer: Self-pay

## 2015-08-12 NOTE — Telephone Encounter (Signed)
Pt is needing a refill on estradol and she has an appt with chelle on 08/17/15 at 100   Best number [phone]

## 2015-08-14 MED ORDER — ESTRADIOL 1 MG PO TABS: ORAL_TABLET | ORAL | Status: DC

## 2015-08-14 NOTE — Telephone Encounter (Signed)
Sent in RF. Tried to call pt x 2 and call kept dropping.

## 2015-08-15 ENCOUNTER — Ambulatory Visit (INDEPENDENT_AMBULATORY_CARE_PROVIDER_SITE_OTHER): Payer: Commercial Managed Care - HMO | Admitting: Family Medicine

## 2015-08-15 ENCOUNTER — Other Ambulatory Visit: Payer: Self-pay | Admitting: Physician Assistant

## 2015-08-15 VITALS — BP 120/80 | HR 76 | Temp 98.0°F | Resp 18 | Ht 65.5 in | Wt 181.0 lb

## 2015-08-15 DIAGNOSIS — Z96652 Presence of left artificial knee joint: Secondary | ICD-10-CM

## 2015-08-15 DIAGNOSIS — J4520 Mild intermittent asthma, uncomplicated: Secondary | ICD-10-CM | POA: Diagnosis not present

## 2015-08-15 DIAGNOSIS — Z9109 Other allergy status, other than to drugs and biological substances: Secondary | ICD-10-CM

## 2015-08-15 DIAGNOSIS — E785 Hyperlipidemia, unspecified: Secondary | ICD-10-CM | POA: Diagnosis not present

## 2015-08-15 DIAGNOSIS — Z131 Encounter for screening for diabetes mellitus: Secondary | ICD-10-CM

## 2015-08-15 DIAGNOSIS — Z91048 Other nonmedicinal substance allergy status: Secondary | ICD-10-CM | POA: Diagnosis not present

## 2015-08-15 DIAGNOSIS — Z720 Tobacco use: Secondary | ICD-10-CM | POA: Diagnosis not present

## 2015-08-15 DIAGNOSIS — Z114 Encounter for screening for human immunodeficiency virus [HIV]: Secondary | ICD-10-CM | POA: Diagnosis not present

## 2015-08-15 DIAGNOSIS — E663 Overweight: Secondary | ICD-10-CM | POA: Diagnosis not present

## 2015-08-15 DIAGNOSIS — Z1159 Encounter for screening for other viral diseases: Secondary | ICD-10-CM | POA: Diagnosis not present

## 2015-08-15 DIAGNOSIS — N951 Menopausal and female climacteric states: Secondary | ICD-10-CM

## 2015-08-15 DIAGNOSIS — I10 Essential (primary) hypertension: Secondary | ICD-10-CM

## 2015-08-15 DIAGNOSIS — R232 Flushing: Secondary | ICD-10-CM

## 2015-08-15 LAB — POCT URINALYSIS DIP (MANUAL ENTRY)
BILIRUBIN UA: NEGATIVE
Blood, UA: NEGATIVE
GLUCOSE UA: NEGATIVE
Ketones, POC UA: NEGATIVE
NITRITE UA: POSITIVE — AB
Protein Ur, POC: NEGATIVE
Spec Grav, UA: >=1.03
UROBILINOGEN UA: 0.2
pH, UA: 5.5

## 2015-08-15 LAB — COMPREHENSIVE METABOLIC PANEL
ALBUMIN: 4.7 g/dL (ref 3.6–5.1)
ALT: 24 U/L (ref 6–29)
AST: 15 U/L (ref 10–35)
Alkaline Phosphatase: 73 U/L (ref 33–130)
BILIRUBIN TOTAL: 0.6 mg/dL (ref 0.2–1.2)
BUN: 26 mg/dL — ABNORMAL HIGH (ref 7–25)
CALCIUM: 10.4 mg/dL (ref 8.6–10.4)
CHLORIDE: 101 mmol/L (ref 98–110)
CO2: 24 mmol/L (ref 20–31)
CREATININE: 0.74 mg/dL (ref 0.50–0.99)
GLUCOSE: 91 mg/dL (ref 65–99)
Potassium: 3.8 mmol/L (ref 3.5–5.3)
SODIUM: 140 mmol/L (ref 135–146)
Total Protein: 6.9 g/dL (ref 6.1–8.1)

## 2015-08-15 LAB — CBC WITH DIFFERENTIAL/PLATELET

## 2015-08-15 LAB — LIPID PANEL
CHOL/HDL RATIO: 3.1 ratio (ref <=5.0)
CHOLESTEROL: 165 mg/dL (ref 125–200)
HDL: 53 mg/dL (ref >=46)
LDL Cholesterol: 78 mg/dL (ref <130)
Triglycerides: 168 mg/dL — ABNORMAL HIGH (ref <150)
VLDL: 34 mg/dL — AB (ref <30)

## 2015-08-15 LAB — TSH: TSH: 2.68 mIU/L

## 2015-08-15 MED ORDER — MONTELUKAST SODIUM 10 MG PO TABS
10.0000 mg | ORAL_TABLET | Freq: Every day | ORAL | Status: DC
Start: 2015-08-15 — End: 2023-12-30

## 2015-08-15 MED ORDER — ATORVASTATIN CALCIUM 40 MG PO TABS: 40.0000 mg | ORAL_TABLET | Freq: Every day | ORAL | Status: AC

## 2015-08-15 MED ORDER — LISINOPRIL-HYDROCHLOROTHIAZIDE 20-25 MG PO TABS: 1.0000 | ORAL_TABLET | Freq: Every day | ORAL | Status: DC

## 2015-08-15 MED ORDER — ESTRADIOL 0.5 MG PO TABS: 0.5000 mg | ORAL_TABLET | Freq: Every day | ORAL | Status: DC

## 2015-08-15 MED ORDER — AMLODIPINE BESYLATE 5 MG PO TABS: ORAL_TABLET | ORAL | Status: AC

## 2015-08-15 NOTE — Patient Instructions (Addendum)
IF you received an x-ray today, you will receive an invoice from [facility]. Please contact [facility] at [phone] with questions or concerns regarding your invoice.   IF you received labwork today, you will receive an invoice from [employer]. Please contact [facility] at [phone] with questions or concerns regarding your invoice.   Our billing staff will not be able to assist you with questions regarding bills from these companies.  You will be contacted with the lab results as soon as they are available. The fastest way to get your results is to activate your My Chart account. Instructions are located on the last page of this paperwork. If you have not heard from [place] regarding the results in 2 weeks, please contact this office.     We recommend that you schedule a mammogram for breast cancer screening. Typically, you do not need a referral to do this. Please contact a local imaging center to schedule your mammogram.  [facility] - [phone]  *ask for the Radiology Department [facility]) - [phone] or [phone]  [facility] - [phone] [facility] - [phone] [facility] - [phone]  *ask for the Radiology Department [facility] - [phone]  *ask for the Radiology Department [facility] - [phone]  *ask for the Mammography Department [facility] - [phone]  Tobacco Use Disorder Tobacco use disorder (TUD) is a mental disorder. It is the long-term use of tobacco in spite of related health problems or difficulty with normal life activities. Tobacco is most commonly smoked as cigarettes and less commonly as cigars or pipes. Smokeless chewing tobacco and snuff are also popular. People with TUD get a feeling of extreme pleasure (euphoria) from using tobacco and have a desire to use it again and  again. Repeated use of tobacco can cause problems. The addictive effects of tobacco are due mainly tothe ingredient nicotine. Nicotine also causes a rush of adrenaline (epinephrine) in the body. This leads to increased blood pressure, heart rate, and breathing rate. These changes may cause problems for people with high blood pressure, weak hearts, or lung disease. High doses of nicotine in children and pets can lead to seizures and death.  Tobacco contains a number of other unsafe chemicals. These chemicals are especially harmful when inhaled as smoke and can damage almost every organ in the body. Smokers live shorter lives than nonsmokers and are at risk of dying from a number of diseases and cancers. Tobacco smoke can also cause health problems for nonsmokers (due to inhaling secondhand smoke). Smoking is also a fire hazard.  TUD usually starts in the late teenage years and is most common in young adults between the ages of 14 and 25 years. People who start smoking earlier in life are more likely to continue smoking as adults. TUD is somewhat more common in men than women. People with TUD are at higher risk for using alcohol and other drugs of abuse. RISK FACTORS Risk factors for TUD include:   Having family members with the disorder.  Being around people who use tobacco.  Having an existing mental health issue such as schizophrenia, depression, bipolar disorder, ADHD, or posttraumatic stress disorder (PTSD). SIGNS AND SYMPTOMS  People with tobacco use disorder have two or more of the following signs and symptoms within 12 months:   Use of more tobacco over a longer period than intended.   Not  able to cut down or control tobacco use.   A lot of time spent obtaining or using tobacco.   Strong desire or urge to use tobacco (craving). Cravings may last for 6 months or longer after quitting.  Use of tobacco even when use leads to major problems at work, school, or home.   Use of tobacco  even when use leads to relationship problems.   Giving up or cutting down on important life activities because of tobacco use.   Repeatedly using tobacco in situations where it puts you or others in physical danger, like smoking in bed.   Use of tobacco even when it is known that a physical or mental problem is likely related to tobacco use.   Physical problems are numerous and may include chronic bronchitis, emphysema, lung and other cancers, gum disease, high blood pressure, heart disease, and stroke.   Mental problems caused by tobacco may include difficulty sleeping and anxiety.  Need to use greater amounts of tobacco to get the same effect. This means you have developed a tolerance.   Withdrawal symptoms as a result of stopping or rapidly cutting back use. These symptoms may last a month or more after quitting and include the following:   Depressed, anxious, or irritable mood.   Difficulty concentrating.   Increased appetite.  Restlessness or trouble sleeping.   Use of tobacco to avoid withdrawal symptoms. DIAGNOSIS  Tobacco use disorder is diagnosed by your health care provider. A diagnosis may be made by:  Your health care provider asking questions about your tobacco use and any problems it may be causing.  A physical exam.  Lab tests.  You may be referred to a mental health professional or addiction specialist. The severity of tobacco use disorder depends on the number of signs and symptoms you have:   Mild--Two or three symptoms.  Moderate--Four or five symptoms.   Severe--Six or more symptoms.  TREATMENT  Many people with tobacco use disorder are unable to quit on their own and need help. Treatment options include the following:  Nicotine replacement therapy (NRT). NRT provides nicotine without the other harmful chemicals in tobacco. NRT gradually lowers the dosage of nicotine in the body and reduces withdrawal symptoms. NRT is available in  over-the-counter forms (gum, lozenges, and skin patches) as well as prescription forms (mouth inhaler and nasal spray).  Medicines.This may include:  Antidepressant medicine that may reduce nicotine cravings.  A medicine that acts on nicotine receptors in the brain to reduce cravings and withdrawal symptoms. It may also block the effects of tobacco in people with TUD who relapse.  Counseling or talk therapy. A form of talk therapy called behavioral therapy is commonly used to treat people with TUD. Behavioral therapy looks at triggers for tobacco use, how to avoid them, and how to cope with cravings. It is most effective in person or by phone but is also available in self-help forms (books and Internet websites).  Support groups. These provide emotional support, advice, and guidance for quitting tobacco. The most effective treatment for TUD is usually a combination of medicine, talk therapy, and support groups. HOME CARE INSTRUCTIONS  Keep all follow-up visits as directed by your health care provider. This is important.  Take medicines only as directed by your health care provider.  Check with your health care provider before starting new prescription or over-the-counter medicines. SEEK MEDICAL CARE IF:  You are not able to take your medicines as prescribed.  Treatment is not helping your  TUD and your symptoms get worse. SEEK IMMEDIATE MEDICAL CARE IF:  You have serious thoughts about hurting yourself or others.  You have trouble breathing, chest pain, sudden weakness, or sudden numbness in part of your body.   This information is not intended to replace advice given to you by your health care provider. Make sure you discuss any questions you have with your health care provider.   Document Released: 10/05/2003 Document Revised: 02/19/2014 Document Reviewed: 03/27/2013 Elsevier Interactive Patient Education [employer]2016 [employer]

## 2015-08-15 NOTE — Progress Notes (Signed)
Subjective:    Patient ID: Mercedes Lucas, female    DOB: 03/09/1955, 60 y.o.   MRN: [id number]  08/15/2015  Medication Refill (Estradiol )   HPI This 60 y.o. female presents for eight month follow-up:   1. HTN: Patient reports good compliance with medication, good tolerance to medication, and good symptom control.  Amlodipine 5mg  daily.  Lisinopril-HCTZ daily.    2.  Hyperlipidemia: Patient reports good compliance with medication, good tolerance to medication, and good symptom control.  Lipitor 40mg  daily.  3.  Menopausal hot flashes: decreased Estrace to 1mg  daily at visit in 12/2014.  Due to tobacco abuse, refusal of mammography, PA Bush recommended weaning off completely and stated that she would not continue to refill.  Wearing the patch.  Out for two weeks now.  Got a headache.  Was taking 2mg  Estradiol.  Decreased 1mg  daily.    4.  L OA knee: still sore of L knee; still soreness.  Retiring this month.  Retiring 09/03/15.  Parents 62 years old; father with CHF.  Mother also elderly.  In and out of the hospital.  Only child.  No longer taking Celebrex for knee.  Refuses pap smear; refuses mammogram.  Not sexually active.  5.  Asthma:  Advair PRN and Singulair.  Not smoking as much.  Diagnosed with asthma by [facility] years ago.  Carries Advair as needed.    6.  Tobacco:  Smoking 1 pack every3 days.  Wearing nicotine patch on and off.    7.  Family history of colon cancer in mother:  Colonoscopy by [facility].    Review of Systems  Constitutional: Negative for fever, chills, diaphoresis and fatigue.  Eyes: Negative for visual disturbance.  Respiratory: Negative for cough and shortness of breath.   Cardiovascular: Negative for chest pain, palpitations and leg swelling.  Gastrointestinal: Negative for nausea, vomiting, abdominal pain, diarrhea and constipation.  Endocrine: Negative for cold intolerance, heat intolerance, polydipsia, polyphagia and polyuria.  Musculoskeletal:  Positive for arthralgias.  Neurological: Negative for dizziness, tremors, seizures, syncope, facial asymmetry, speech difficulty, weakness, light-headedness, numbness and headaches.    Past Medical History:  Diagnosis Date  . Allergy   . Arthritis   . Asthma    "mild-no attacks"  . Headache    migraines occasional  . Hypercholesteremia   . Hypertension    Past Surgical History:  Procedure Laterality Date  . ABDOMINAL HYSTERECTOMY  1989  . ANKLE SURGERY  2005  . APPENDECTOMY  2000  . BLADDER SURGERY  2005   bladder stretch  . bladder tact  1997  . BREAST SURGERY Left 1987   biopsy  . CHOLECYSTECTOMY  2000  . COLONOSCOPY  2007  . ELBOW SURGERY Left 2003   "damaged funny bone"  . LAPAROSCOPY  1992  . MENISCUS REPAIR  2012  . TONSILLECTOMY  30's  . TOTAL KNEE ARTHROPLASTY Left 06/01/2014   Procedure: LEFT TOTAL KNEE ARTHROPLASTY;  Surgeon: Durene RomansMatthew Olin, MD;  Location: [facility];  Service: Orthopedics;  Laterality: Left;  . TUBAL LIGATION  1987   Allergies  Allergen Reactions  . Bee Venom Anaphylaxis  . Erythromycin Anaphylaxis and Nausea Only  . Codeine Nausea Only  . Adhesive [Tape] Rash    "From bandaids"     Social History   Social History  . Marital status: Married    Spouse name: N/A  . Number of children: N/A  . Years of education: N/A   Occupational History  . Not on  file.   Social History Main Topics  . Smoking status: Current Every Day Smoker    Packs/day: 1.00    Years: 43.00    Types: Cigarettes  . Smokeless tobacco: Never Used  . Alcohol use Yes     Comment: rare  . Drug use: No  . Sexual activity: Yes   Other Topics Concern  . Not on file   Social History Narrative   Marital status: married x 15 years      Children: 2 sons (police office in [place]; one works for [employer])      Employment: [employer] x 2002      Tobacco:  1 ppd x 30 years      Alcohol: socially; weekends      Drgus: none since 1970s; marijuana in 1970s       Exercise:        Family History  Problem Relation Age of Onset  . Cancer Mother 46    colon cancer  . Hypertension Mother   . Hyperlipidemia Mother   . Cancer Father     prostate cancer, throat cancer  . Heart disease Father 47    CABG/CAD; CHF       Objective:    BP 120/80   Pulse 76   Temp 98 F (36.7 C) (Oral)   Resp 18   Ht 5' 5.5" (1.664 m)   Wt 181 lb (82.1 kg)   SpO2 96%   BMI 29.66 kg/m  Physical Exam  Constitutional: She is oriented to person, place, and time. She appears well-developed and well-nourished. No distress.  HENT:  Head: Normocephalic and atraumatic.  Right Ear: External ear normal.  Left Ear: External ear normal.  Nose: Nose normal.  Mouth/Throat: Oropharynx is clear and moist.  Eyes: Conjunctivae and EOM are normal. Pupils are equal, round, and reactive to light.  Neck: Normal range of motion. Neck supple. Carotid bruit is not present. No thyromegaly present.  Cardiovascular: Normal rate, regular rhythm, normal heart sounds and intact distal pulses.  Exam reveals no gallop and no friction rub.   No murmur heard. Pulmonary/Chest: Effort normal and breath sounds normal. She has no wheezes. She has no rales.  Abdominal: Soft. Bowel sounds are normal. She exhibits no distension and no mass. There is no tenderness. There is no rebound and no guarding.  Lymphadenopathy:    She has no cervical adenopathy.  Neurological: She is alert and oriented to person, place, and time. No cranial nerve deficit.  Skin: Skin is warm and dry. No rash noted. She is not diaphoretic. No erythema. No pallor.  Psychiatric: She has a normal mood and affect. Her behavior is normal.        Assessment & Plan:   1. Essential hypertension   2. Hyperlipidemia   3. Hot flashes   4. Environmental allergies   5. Asthma, mild intermittent, uncomplicated   6. S/P left TKA   7. Overweight (BMI 25.0-29.9)   8. Tobacco abuse   9. Need for hepatitis C screening test   10.  Screening for HIV (human immunodeficiency virus)   11. Screening for diabetes mellitus     Orders Placed This Encounter  Procedures  . CBC with Differential/Platelet  . Comprehensive metabolic panel    Order Specific Question:   Has the patient fasted?    Answer:   Yes  . Hemoglobin A1c  . Lipid panel    Order Specific Question:   Has the patient fasted?  Answer:   Yes  . TSH  . HIV antibody  . Hepatitis C antibody  . POCT urinalysis dipstick   -controlled. -smoking cessation encouraged. -decrease Estrace to 0.5mg  daily due to tobacco abuse, age, HTN. -obtain age appropriate labs. -pt refuses pap smear and mammogram; thus,definitely further reason to wean off of Estrace.   Meds ordered this encounter  Medications  . estradiol (ESTRACE) 0.5 MG tablet    Sig: Take 1 tablet (0.5 mg total) by mouth daily.    Dispense:  30 tablet    Refill:  5  . atorvastatin (LIPITOR) 40 MG tablet    Sig: Take 1 tablet (40 mg total) by mouth daily.    Dispense:  90 tablet    Refill:  1  . amLODipine (NORVASC) 5 MG tablet    Sig: TAKE 1 TABLET(5 MG) BY MOUTH DAILY    Dispense:  90 tablet    Refill:  1  . lisinopril-hydrochlorothiazide (PRINZIDE,ZESTORETIC) 20-25 MG tablet    Sig: Take 1 tablet by mouth daily.    Dispense:  90 tablet    Refill:  1  . montelukast (SINGULAIR) 10 MG tablet    Sig: Take 1 tablet (10 mg total) by mouth at bedtime.    Dispense:  90 tablet    Refill:  3    Return in about 6 months (around 02/15/2016) for recheck high blood pressure, high cholesterol, asthma.    Bradie Sangiovanni Paulita FujitaMartin Taniyah Ballow, M.D. [facility]  [place] [address] [place], [place]  [id number] [phone] phone [phone] fax

## 2015-08-16 LAB — HEMOGLOBIN A1C

## 2015-08-16 LAB — HIV ANTIBODY (ROUTINE TESTING W REFLEX): HIV 1&2 Ab, 4th Generation: NONREACTIVE

## 2015-08-16 LAB — HEPATITIS C ANTIBODY: HCV Ab: NEGATIVE

## 2015-08-17 ENCOUNTER — Ambulatory Visit: Payer: Commercial Managed Care - HMO | Admitting: Physician Assistant

## 2015-08-18 NOTE — Telephone Encounter (Signed)
Pt came into office 7/3.

## 2015-08-20 ENCOUNTER — Encounter: Payer: Self-pay | Admitting: Family Medicine

## 2015-08-25 ENCOUNTER — Other Ambulatory Visit: Payer: Self-pay | Admitting: Physician Assistant

## 2015-09-09 ENCOUNTER — Other Ambulatory Visit: Payer: Self-pay | Admitting: Physician Assistant

## 2015-09-13 ENCOUNTER — Other Ambulatory Visit: Payer: Self-pay | Admitting: Physician Assistant

## 2016-02-26 ENCOUNTER — Other Ambulatory Visit: Payer: Self-pay | Admitting: Family Medicine

## 2016-02-27 ENCOUNTER — Other Ambulatory Visit: Payer: Self-pay | Admitting: Family Medicine

## 2016-03-09 DIAGNOSIS — K603 Anal fistula: Secondary | ICD-10-CM | POA: Diagnosis not present

## 2016-03-09 DIAGNOSIS — Z23 Encounter for immunization: Secondary | ICD-10-CM | POA: Diagnosis not present

## 2016-03-09 DIAGNOSIS — K644 Residual hemorrhoidal skin tags: Secondary | ICD-10-CM | POA: Diagnosis not present

## 2016-03-20 DIAGNOSIS — R7301 Impaired fasting glucose: Secondary | ICD-10-CM | POA: Diagnosis not present

## 2016-03-20 DIAGNOSIS — I10 Essential (primary) hypertension: Secondary | ICD-10-CM | POA: Diagnosis not present

## 2016-03-20 DIAGNOSIS — J4520 Mild intermittent asthma, uncomplicated: Secondary | ICD-10-CM | POA: Diagnosis not present

## 2016-03-20 DIAGNOSIS — E785 Hyperlipidemia, unspecified: Secondary | ICD-10-CM | POA: Diagnosis not present

## 2016-03-20 DIAGNOSIS — M2550 Pain in unspecified joint: Secondary | ICD-10-CM | POA: Diagnosis not present

## 2016-05-17 ENCOUNTER — Other Ambulatory Visit: Payer: Self-pay | Admitting: Family Medicine

## 2016-05-18 ENCOUNTER — Other Ambulatory Visit: Payer: Self-pay | Admitting: Family Medicine

## 2016-05-20 ENCOUNTER — Other Ambulatory Visit: Payer: Self-pay | Admitting: Family Medicine

## 2016-05-23 ENCOUNTER — Other Ambulatory Visit: Payer: Self-pay | Admitting: Family Medicine

## 2016-06-12 DIAGNOSIS — Z23 Encounter for immunization: Secondary | ICD-10-CM | POA: Diagnosis not present

## 2016-06-28 DIAGNOSIS — Z23 Encounter for immunization: Secondary | ICD-10-CM | POA: Diagnosis not present

## 2016-09-19 DIAGNOSIS — Z23 Encounter for immunization: Secondary | ICD-10-CM | POA: Diagnosis not present

## 2016-12-17 DIAGNOSIS — E559 Vitamin D deficiency, unspecified: Secondary | ICD-10-CM | POA: Diagnosis not present

## 2016-12-17 DIAGNOSIS — K644 Residual hemorrhoidal skin tags: Secondary | ICD-10-CM | POA: Diagnosis not present

## 2016-12-17 DIAGNOSIS — I10 Essential (primary) hypertension: Secondary | ICD-10-CM | POA: Diagnosis not present

## 2016-12-17 DIAGNOSIS — E785 Hyperlipidemia, unspecified: Secondary | ICD-10-CM | POA: Diagnosis not present

## 2016-12-26 DIAGNOSIS — H5203 Hypermetropia, bilateral: Secondary | ICD-10-CM | POA: Diagnosis not present

## 2017-04-05 DIAGNOSIS — J101 Influenza due to other identified influenza virus with other respiratory manifestations: Secondary | ICD-10-CM | POA: Diagnosis not present

## 2017-04-05 DIAGNOSIS — R05 Cough: Secondary | ICD-10-CM | POA: Diagnosis not present

## 2017-04-05 DIAGNOSIS — R69 Illness, unspecified: Secondary | ICD-10-CM | POA: Diagnosis not present

## 2017-04-15 DIAGNOSIS — J449 Chronic obstructive pulmonary disease, unspecified: Secondary | ICD-10-CM | POA: Diagnosis not present

## 2017-07-02 DIAGNOSIS — E785 Hyperlipidemia, unspecified: Secondary | ICD-10-CM | POA: Diagnosis not present

## 2017-07-02 DIAGNOSIS — R7303 Prediabetes: Secondary | ICD-10-CM | POA: Diagnosis not present

## 2017-07-02 DIAGNOSIS — J4520 Mild intermittent asthma, uncomplicated: Secondary | ICD-10-CM | POA: Diagnosis not present

## 2017-07-02 DIAGNOSIS — I10 Essential (primary) hypertension: Secondary | ICD-10-CM | POA: Diagnosis not present

## 2017-07-09 ENCOUNTER — Encounter: Payer: Self-pay | Admitting: Family Medicine

## 2017-11-04 DIAGNOSIS — Z23 Encounter for immunization: Secondary | ICD-10-CM | POA: Diagnosis not present

## 2018-01-02 DIAGNOSIS — M25562 Pain in left knee: Secondary | ICD-10-CM | POA: Diagnosis not present

## 2018-01-02 DIAGNOSIS — J4520 Mild intermittent asthma, uncomplicated: Secondary | ICD-10-CM | POA: Diagnosis not present

## 2018-01-02 DIAGNOSIS — I10 Essential (primary) hypertension: Secondary | ICD-10-CM | POA: Diagnosis not present

## 2018-01-02 DIAGNOSIS — E785 Hyperlipidemia, unspecified: Secondary | ICD-10-CM | POA: Diagnosis not present

## 2018-01-02 DIAGNOSIS — R7303 Prediabetes: Secondary | ICD-10-CM | POA: Diagnosis not present

## 2018-03-01 DIAGNOSIS — Z0100 Encounter for examination of eyes and vision without abnormal findings: Secondary | ICD-10-CM | POA: Diagnosis not present

## 2019-03-13 ENCOUNTER — Ambulatory Visit: Payer: 59

## 2019-03-21 ENCOUNTER — Ambulatory Visit: Payer: Medicare Other | Attending: Internal Medicine

## 2019-03-21 DIAGNOSIS — Z23 Encounter for immunization: Secondary | ICD-10-CM

## 2019-03-21 NOTE — Progress Notes (Signed)
   Covid-19 Vaccination Clinic  Name:  Mercedes Lucas    MRN: [id number] DOB: 01-31-1956  03/21/2019  Mercedes Lucas was observed post Covid-19 immunization for 30 minutes based on pre-vaccination screening without incidence. She was provided with Vaccine Information Sheet and instruction to access the V-Safe system.   Mercedes Lucas was instructed to call 911 with any severe reactions post vaccine: Marland Kitchen Difficulty breathing  . Swelling of your face and throat  . A fast heartbeat  . A bad rash all over your body  . Dizziness and weakness    Immunizations Administered    Name Date Dose VIS Date Route   Pfizer COVID-19 Vaccine 03/21/2019  3:48 PM 0.3 mL 01/23/2019 Intramuscular   Manufacturer: [employer], [employer]   Lot: [id number]   NDC: [address]-2

## 2019-03-24 ENCOUNTER — Ambulatory Visit: Payer: 59

## 2019-04-15 ENCOUNTER — Ambulatory Visit: Payer: Medicare Other | Attending: Internal Medicine

## 2019-04-15 DIAGNOSIS — Z23 Encounter for immunization: Secondary | ICD-10-CM | POA: Insufficient documentation

## 2019-04-15 NOTE — Progress Notes (Signed)
   Covid-19 Vaccination Clinic  Name:  Mercedes Lucas    MRN: [id number] DOB: 1955/11/02  04/15/2019  Ms. Mercedes Lucas was observed post Covid-19 immunization for 15 minutes without incident. She was provided with Vaccine Information Sheet and instruction to access the V-Safe system.   Ms. Mercedes Lucas was instructed to call 911 with any severe reactions post vaccine: Marland Kitchen Difficulty breathing  . Swelling of face and throat  . A fast heartbeat  . A bad rash all over body  . Dizziness and weakness   Immunizations Administered    Name Date Dose VIS Date Route   Pfizer COVID-19 Vaccine 04/15/2019 11:45 AM 0.3 mL 01/23/2019 Intramuscular   Manufacturer: [employer], [employer]   Lot: [id number]   NDC: [address]-8

## 2020-02-19 DIAGNOSIS — B349 Viral infection, unspecified: Secondary | ICD-10-CM | POA: Diagnosis not present

## 2020-02-19 DIAGNOSIS — R051 Acute cough: Secondary | ICD-10-CM | POA: Diagnosis not present

## 2020-02-19 DIAGNOSIS — R059 Cough, unspecified: Secondary | ICD-10-CM | POA: Diagnosis not present

## 2020-02-19 DIAGNOSIS — Z03818 Encounter for observation for suspected exposure to other biological agents ruled out: Secondary | ICD-10-CM | POA: Diagnosis not present

## 2020-02-19 DIAGNOSIS — Z20822 Contact with and (suspected) exposure to covid-19: Secondary | ICD-10-CM | POA: Diagnosis not present

## 2020-02-19 DIAGNOSIS — R509 Fever, unspecified: Secondary | ICD-10-CM | POA: Diagnosis not present

## 2020-04-20 DIAGNOSIS — Z0000 Encounter for general adult medical examination without abnormal findings: Secondary | ICD-10-CM | POA: Diagnosis not present

## 2020-04-20 DIAGNOSIS — R350 Frequency of micturition: Secondary | ICD-10-CM | POA: Diagnosis not present

## 2020-04-20 DIAGNOSIS — E7800 Pure hypercholesterolemia, unspecified: Secondary | ICD-10-CM | POA: Diagnosis not present

## 2020-04-20 DIAGNOSIS — I10 Essential (primary) hypertension: Secondary | ICD-10-CM | POA: Diagnosis not present

## 2020-04-20 DIAGNOSIS — E559 Vitamin D deficiency, unspecified: Secondary | ICD-10-CM | POA: Diagnosis not present

## 2020-04-20 DIAGNOSIS — Z1389 Encounter for screening for other disorder: Secondary | ICD-10-CM | POA: Diagnosis not present

## 2020-04-20 DIAGNOSIS — Z1159 Encounter for screening for other viral diseases: Secondary | ICD-10-CM | POA: Diagnosis not present

## 2020-04-20 DIAGNOSIS — Z23 Encounter for immunization: Secondary | ICD-10-CM | POA: Diagnosis not present

## 2020-04-20 DIAGNOSIS — J4520 Mild intermittent asthma, uncomplicated: Secondary | ICD-10-CM | POA: Diagnosis not present

## 2020-04-20 DIAGNOSIS — R7303 Prediabetes: Secondary | ICD-10-CM | POA: Diagnosis not present

## 2020-04-20 DIAGNOSIS — D72829 Elevated white blood cell count, unspecified: Secondary | ICD-10-CM | POA: Diagnosis not present

## 2020-04-20 DIAGNOSIS — Z1211 Encounter for screening for malignant neoplasm of colon: Secondary | ICD-10-CM | POA: Diagnosis not present

## 2020-04-22 ENCOUNTER — Other Ambulatory Visit: Payer: Self-pay | Admitting: Family Medicine

## 2020-04-22 DIAGNOSIS — E2839 Other primary ovarian failure: Secondary | ICD-10-CM

## 2020-05-12 ENCOUNTER — Other Ambulatory Visit: Payer: Self-pay | Admitting: Family Medicine

## 2020-05-12 DIAGNOSIS — Z1231 Encounter for screening mammogram for malignant neoplasm of breast: Secondary | ICD-10-CM

## 2020-07-04 ENCOUNTER — Other Ambulatory Visit: Payer: Self-pay

## 2020-07-04 ENCOUNTER — Ambulatory Visit
Admission: RE | Admit: 2020-07-04 | Discharge: 2020-07-04 | Disposition: A | Discharge status: Home / Self Care | Payer: Medicare Other | Source: Ambulatory Visit | Attending: Family Medicine | Admitting: Family Medicine

## 2020-07-04 DIAGNOSIS — Z1231 Encounter for screening mammogram for malignant neoplasm of breast: Secondary | ICD-10-CM

## 2020-08-01 ENCOUNTER — Other Ambulatory Visit: Payer: Self-pay

## 2020-08-01 ENCOUNTER — Ambulatory Visit (INDEPENDENT_AMBULATORY_CARE_PROVIDER_SITE_OTHER): Payer: Medicare Other | Admitting: Internal Medicine

## 2020-08-01 ENCOUNTER — Encounter: Payer: Self-pay | Admitting: Internal Medicine

## 2020-08-01 VITALS — BP 122/64 | HR 60 | Ht 65.0 in | Wt 177.0 lb

## 2020-08-01 DIAGNOSIS — E213 Hyperparathyroidism, unspecified: Secondary | ICD-10-CM | POA: Diagnosis not present

## 2020-08-01 LAB — BASIC METABOLIC PANEL
BUN: 18 mg/dL (ref 6–23)
CO2: 26 mEq/L (ref 19–32)
Calcium: 10.8 mg/dL — ABNORMAL HIGH (ref 8.4–10.5)
Chloride: 103 mEq/L (ref 96–112)
Creatinine, Ser: 0.72 mg/dL (ref 0.40–1.20)
GFR: 86.85 mL/min (ref >60.00)
Glucose, Bld: 128 mg/dL — ABNORMAL HIGH (ref 70–99)
Potassium: 3.9 mEq/L (ref 3.5–5.1)
Sodium: 137 mEq/L (ref 135–145)

## 2020-08-01 LAB — VITAMIN D 25 HYDROXY (VIT D DEFICIENCY, FRACTURES): VITD: 57.51 ng/mL (ref 30.00–100.00)

## 2020-08-01 LAB — ALBUMIN: Albumin: 4.5 g/dL (ref 3.5–5.2)

## 2020-08-01 MED ORDER — LISINOPRIL 20 MG PO TABS: 20.0000 mg | ORAL_TABLET | Freq: Every day | ORAL | 3 refills | Status: DC

## 2020-08-01 NOTE — Patient Instructions (Addendum)
-   STOP Lisinopril- HCTZ and START "Plain" Lisinopril 20 mg daily  - STAY hydrated  - Consume 2-3 servings of low fat dairy daily      One of the four Parathyroid gland has a growth and its making extra calcium, our goal is to evaluate you to see if you need surgery at any point in time

## 2020-08-01 NOTE — Progress Notes (Signed)
Name: Mercedes Lucas  MRN/ DOB: [id number], 1955/12/05    Age/ Sex: 65 y.o., female    PCP: Patient, No Pcp Per (Inactive)   Reason for Endocrinology Evaluation: Hypercalcemia      Date of Initial Endocrinology Evaluation: 08/01/2020     HPI: Mercedes Lucas is a 65 y.o. female with a past medical history of HTN, Asthma . The patient presented for initial endocrinology clinic visit on 08/01/2020 for consultative assistance with her Hypercalcemia .   Mercedes Lucas indicates that she was first diagnosed with hypercalcemia in 2022. Since that time, she has experienced symptoms of polyuria, polydipsia but denies constipation.   She denies  use of over the counter calcium (including supplements,  Rolaids, or other calcium containing antacids), rarely uses Tums. No prior lithium use  She is on HCTZ, but no vitamin D supplements. ( Stopped Vitamin D recently )    Has a bone density schedule 8/2nd.   She denies  history of kidney stones, kidney disease, liver disease, granulomatous disease. She denies  osteoporosis but has prior fractures (left ankle) and left wrist ( slip injury) . Daily dietary calcium intake: 1 servings. She admits to  family history of osteoporosis ( mother) , but no parathyroid disease, thyroid disease.       HISTORY:  Past Medical History:  Past Medical History:  Diagnosis Date  . Allergy   . Arthritis   . Asthma    "mild-no attacks"  . Headache    migraines occasional  . Hypercholesteremia   . Hypertension    Past Surgical History:  Past Surgical History:  Procedure Laterality Date  . ABDOMINAL HYSTERECTOMY  1989  . ANKLE SURGERY  2005  . APPENDECTOMY  2000  . BLADDER SURGERY  2005   bladder stretch  . bladder tact  1997  . BREAST SURGERY Left 1987   biopsy  . CHOLECYSTECTOMY  2000  . COLONOSCOPY  2007  . ELBOW SURGERY Left 2003   "damaged funny bone"  . LAPAROSCOPY  1992  . MENISCUS REPAIR  2012  . TONSILLECTOMY  30's  . TOTAL KNEE  ARTHROPLASTY Left 06/01/2014   Procedure: LEFT TOTAL KNEE ARTHROPLASTY;  Surgeon: Durene Romans, MD;  Location: [facility];  Service: Orthopedics;  Laterality: Left;  . TUBAL LIGATION  1987    Social History:  reports that she has been smoking cigarettes. She has a 43.00 pack-year smoking history. She has never used smokeless tobacco. She reports current alcohol use. She reports that she does not use drugs. Family History: family history includes Cancer in her father; Cancer (age of onset: 51) in her mother; Heart disease (age of onset: 38) in her father; Hyperlipidemia in her mother; Hypertension in her mother.   HOME MEDICATIONS: Allergies as of 08/01/2020       Reactions   Bee Venom Anaphylaxis   Erythromycin Anaphylaxis, Nausea Only   Codeine Nausea Only   Adhesive [tape] Rash   "From bandaids"        Medication List        Accurate as of August 01, 2020  4:42 PM. If you have any questions, ask your nurse or doctor.          STOP taking these medications    lisinopril-hydrochlorothiazide 20-25 MG tablet Commonly known as: ZESTORETIC Stopped by: Scarlette Shorts, MD       TAKE these medications    albuterol 108 (90 Base) MCG/ACT inhaler Commonly known as: VENTOLIN HFA Inhale  1-2 puffs into the lungs every 6 (six) hours as needed for wheezing.   amLODipine 5 MG tablet Commonly known as: NORVASC TAKE 1 TABLET(5 MG) BY MOUTH DAILY   atorvastatin 40 MG tablet Commonly known as: LIPITOR Take 1 tablet (40 mg total) by mouth daily.   celecoxib 200 MG capsule Commonly known as: CELEBREX Take 200 mg by mouth daily as needed for mild pain.   estradiol 0.5 MG tablet Commonly known as: ESTRACE Take 1 tablet (0.5 mg total) by mouth daily.   Fluticasone-Salmeterol 100-50 MCG/DOSE Aepb Commonly known as: ADVAIR Inhale 1 puff into the lungs daily as needed (Shortness of breath).   HYDROcodone-acetaminophen 7.5-325 MG tablet Commonly known as: NORCO Take 1-2 tablets  by mouth every 4 (four) hours as needed for moderate pain.   lisinopril 20 MG tablet Commonly known as: ZESTRIL Take 1 tablet (20 mg total) by mouth daily. Started by: Scarlette Shorts, MD   montelukast 10 MG tablet Commonly known as: SINGULAIR Take 1 tablet (10 mg total) by mouth at bedtime.   nicotine 14 mg/24hr patch Commonly known as: NICODERM CQ - dosed in mg/24 hours Place 1 patch (14 mg total) onto the skin daily.          REVIEW OF SYSTEMS: A comprehensive ROS was conducted with the patient and is negative except as per HPI     OBJECTIVE:  VS: BP 122/64   Pulse 60   Ht 5\' 5"  (1.651 m)   Wt 177 lb (80.3 kg)   SpO2 96%   BMI 29.45 kg/m    Wt Readings from Last 3 Encounters:  08/01/20 177 lb (80.3 kg)  08/15/15 181 lb (82.1 kg)  12/24/14 170 lb 12.8 oz (77.5 kg)     EXAM: General: Pt appears well and is in NAD  Neck: General: Supple without adenopathy. Thyroid: Thyroid size normal.  No goiter or nodules appreciated. No thyroid bruit.  Lungs: Clear with good BS bilat with no rales, rhonchi, or wheezes  Heart: Auscultation: RRR.  Abdomen: Normoactive bowel sounds, soft, nontender, without masses or organomegaly palpable  Extremities:  BL LE: No pretibial edema normal ROM and strength.  Skin: Hair: Texture and amount normal with gender appropriate distribution Skin Inspection: No rashes Skin Palpation: Skin temperature, texture, and thickness normal to palpation  Neuro: Cranial nerves: II - XII grossly intact  Motor: Normal strength throughout DTRs: 2+ and symmetric in UE without delay in relaxation phase  Mental Status: Judgment, insight: Intact Orientation: Oriented to time, place, and person Mood and affect: No depression, anxiety, or agitation     DATA REVIEWED:  Results for Mercedes Lucas, Mercedes Lucas" (MRN Mercedes Lucas) as of 08/02/2020 09:11  Ref. Range 08/01/2020 09:17  Sodium Latest Ref Range: 135 - 145 mEq/L 137  Potassium Latest Ref Range:  3.5 - 5.1 mEq/L 3.9  Chloride Latest Ref Range: 96 - 112 mEq/L 103  CO2 Latest Ref Range: 19 - 32 mEq/L 26  Glucose Latest Ref Range: 70 - 99 mg/dL 08/03/2020 (H)  BUN Latest Ref Range: 6 - 23 mg/dL 18  Creatinine Latest Ref Range: 0.40 - 1.20 mg/dL 470  Calcium Latest Ref Range: 8.4 - 10.5 mg/dL 9.62 (H)  Albumin Latest Ref Range: 3.5 - 5.2 g/dL 4.5  GFR Latest Ref Range: >60.00 mL/min 86.85  Results for Mercedes Lucas, Mercedes Lucas" (MRN Mercedes Lucas) as of 08/02/2020 09:11  Ref. Range 08/01/2020 09:17  VITD Latest Ref Range: 30.00 - 100.00 ng/mL 57.51   PTH- pending  05/09/2020 Serum calcium 10.7 mg/DL PTH 63 PG/mL N2T 5.5% Corrected calcium 10.76  ASSESSMENT/PLAN/RECOMMENDATIONS:   Hyperparathyroidism   -The differential diagnosis includes familial hypercalcinuric hypercalcemia (FHH) vs Primary Hyperparathyroidism (pHPT)  - It is important to differentiate between the two, as FHH does not cause any organ damage and does not require further follow up , on the other hand pHPT could cause end organ damage and would require further evaluation.  -At this time she has been advised to avoid all OTC calcium including Tums, she may use Pepcid or any other antacids -I am going to stop her HCTZ as this will skew her 24-hour urinary excretion of calcium that we plan on proceeding with in the future -She has a bone density scheduled for August 2 I will also order to include forearm -We will proceed with KUB for renal stones or nephrocalcinosis   Recommendations  STOP Lisinopril- HCTZ and START "Plain" Lisinopril 20 mg daily  - STAY hydrated  - Consume 2-3 servings of low fat dairy daily   Follow-up in 3 months  Signed electronically by: Lyndle Herrlich, MD  [facility]  [facility] [address] [place] [place], [place] [address] Phone: [phone] FAX: [phone]   CC: Patient, No Pcp Per (Inactive) No address on file Phone: None Fax:  None   Return to Endocrinology clinic as below: Future Appointments  Date Time Provider Department Center  09/13/2020  7:30 AM [facility] DX DEXA 1 [facility]  11/02/2020  7:50 AM Saran Laviolette, Konrad Dolores, MD [facility] None

## 2020-08-02 ENCOUNTER — Encounter: Payer: Self-pay | Admitting: Internal Medicine

## 2020-08-02 LAB — PARATHYROID HORMONE, INTACT (NO CA): PTH: 62 pg/mL (ref 16–77)

## 2020-08-02 LAB — CALCIUM, IONIZED: Calcium, Ion: 5.77 mg/dL — ABNORMAL HIGH (ref 4.8–5.6)

## 2020-08-29 ENCOUNTER — Ambulatory Visit
Admission: RE | Admit: 2020-08-29 | Discharge: 2020-08-29 | Disposition: A | Discharge status: Home / Self Care | Payer: Medicare Other | Source: Ambulatory Visit | Attending: Internal Medicine | Admitting: Internal Medicine

## 2020-08-29 DIAGNOSIS — Z9049 Acquired absence of other specified parts of digestive tract: Secondary | ICD-10-CM | POA: Diagnosis not present

## 2020-08-29 DIAGNOSIS — E213 Hyperparathyroidism, unspecified: Secondary | ICD-10-CM

## 2020-09-13 ENCOUNTER — Ambulatory Visit
Admission: RE | Admit: 2020-09-13 | Discharge: 2020-09-13 | Disposition: A | Discharge status: Home / Self Care | Payer: Medicare Other | Source: Ambulatory Visit | Attending: Family Medicine | Admitting: Family Medicine

## 2020-09-13 ENCOUNTER — Other Ambulatory Visit: Payer: Self-pay

## 2020-09-13 DIAGNOSIS — M85852 Other specified disorders of bone density and structure, left thigh: Secondary | ICD-10-CM | POA: Diagnosis not present

## 2020-09-13 DIAGNOSIS — E2839 Other primary ovarian failure: Secondary | ICD-10-CM

## 2020-09-13 DIAGNOSIS — Z780 Asymptomatic menopausal state: Secondary | ICD-10-CM | POA: Diagnosis not present

## 2020-10-25 DIAGNOSIS — R7303 Prediabetes: Secondary | ICD-10-CM | POA: Diagnosis not present

## 2020-10-25 DIAGNOSIS — E213 Hyperparathyroidism, unspecified: Secondary | ICD-10-CM | POA: Diagnosis not present

## 2020-10-25 DIAGNOSIS — D72829 Elevated white blood cell count, unspecified: Secondary | ICD-10-CM | POA: Diagnosis not present

## 2020-10-25 DIAGNOSIS — Z780 Asymptomatic menopausal state: Secondary | ICD-10-CM | POA: Diagnosis not present

## 2020-10-25 DIAGNOSIS — F17210 Nicotine dependence, cigarettes, uncomplicated: Secondary | ICD-10-CM | POA: Diagnosis not present

## 2020-10-25 DIAGNOSIS — J4520 Mild intermittent asthma, uncomplicated: Secondary | ICD-10-CM | POA: Diagnosis not present

## 2020-10-25 DIAGNOSIS — Z1211 Encounter for screening for malignant neoplasm of colon: Secondary | ICD-10-CM | POA: Diagnosis not present

## 2020-10-25 DIAGNOSIS — E7800 Pure hypercholesterolemia, unspecified: Secondary | ICD-10-CM | POA: Diagnosis not present

## 2020-10-25 DIAGNOSIS — G4700 Insomnia, unspecified: Secondary | ICD-10-CM | POA: Diagnosis not present

## 2020-10-25 DIAGNOSIS — I10 Essential (primary) hypertension: Secondary | ICD-10-CM | POA: Diagnosis not present

## 2020-11-02 ENCOUNTER — Ambulatory Visit (INDEPENDENT_AMBULATORY_CARE_PROVIDER_SITE_OTHER): Payer: Medicare Other | Admitting: Internal Medicine

## 2020-11-02 ENCOUNTER — Encounter: Payer: Self-pay | Admitting: Internal Medicine

## 2020-11-02 ENCOUNTER — Other Ambulatory Visit: Payer: Self-pay

## 2020-11-02 VITALS — BP 126/74 | HR 66 | Ht 65.0 in | Wt 178.2 lb

## 2020-11-02 DIAGNOSIS — E213 Hyperparathyroidism, unspecified: Secondary | ICD-10-CM | POA: Diagnosis not present

## 2020-11-02 LAB — BASIC METABOLIC PANEL
BUN: 14 mg/dL (ref 6–23)
CO2: 27 mEq/L (ref 19–32)
Calcium: 10.6 mg/dL — ABNORMAL HIGH (ref 8.4–10.5)
Chloride: 105 mEq/L (ref 96–112)
Creatinine, Ser: 0.67 mg/dL (ref 0.40–1.20)
GFR: 90.63 mL/min (ref >60.00)
Glucose, Bld: 109 mg/dL — ABNORMAL HIGH (ref 70–99)
Potassium: 4 mEq/L (ref 3.5–5.1)
Sodium: 139 mEq/L (ref 135–145)

## 2020-11-02 LAB — ALBUMIN: Albumin: 4.3 g/dL (ref 3.5–5.2)

## 2020-11-02 LAB — VITAMIN D 25 HYDROXY (VIT D DEFICIENCY, FRACTURES): VITD: 41.9 ng/mL (ref 30.00–100.00)

## 2020-11-02 NOTE — Progress Notes (Signed)
Name: Mercedes Lucas  MRN/ DOB: [id number], Jun 20, 1955    Age/ Sex: 65 y.o., female     PCP: Patient, No Pcp Per (Inactive)   Reason for Endocrinology Evaluation: Hyperparathyroidism     Initial Endocrinology Clinic Visit: 08/01/2020    PATIENT IDENTIFIER: Mercedes Lucas is a 65 y.o., female with a past medical history of Asthma and HTN. She has followed with [facility] since 08/01/2020 for consultative assistance with management of her Hyperthyroidism.   HISTORICAL SUMMARY:  Mercedes Lucas indicates that she was first diagnosed with hypercalcemia in 2022 with a serum calcium 10.8 mg/dL (corrected 95.63) inappropriately normal PTH at 62 pg/mL   She was on HCTZ which we stopped   KUB demonstrated a tiny punctate stone versus bowel contents projects over the right Kidney 08/2020 DXA showed low bone density 09/2020  No Hx of renal stones   SUBJECTIVE:     Today (11/02/2020):  Mercedes Lucas is here for hyperparathyroidism.  She has polydipsia and  polyuria  Has occasional constipation      She has been avoiding OTC calcium  She stays hydrated  She has been on 2 servings of calcium daily    HISTORY:  Past Medical History:  Past Medical History:  Diagnosis Date   Allergy    Arthritis    Asthma    "mild-no attacks"   Headache    migraines occasional   Hypercholesteremia    Hypertension    Past Surgical History:  Past Surgical History:  Procedure Laterality Date   ABDOMINAL HYSTERECTOMY  1989   ANKLE SURGERY  2005   APPENDECTOMY  2000   BLADDER SURGERY  2005   bladder stretch   bladder tact  1997   BREAST SURGERY Left 1987   biopsy   CHOLECYSTECTOMY  2000   COLONOSCOPY  2007   ELBOW SURGERY Left 2003   "damaged funny bone"   LAPAROSCOPY  1992   MENISCUS REPAIR  2012   TONSILLECTOMY  30's   TOTAL KNEE ARTHROPLASTY Left 06/01/2014   Procedure: LEFT TOTAL KNEE ARTHROPLASTY;  Surgeon: Durene Romans, MD;  Location: [facility];  Service:  Orthopedics;  Laterality: Left;   TUBAL LIGATION  1987   Social History:  reports that she has been smoking cigarettes. She has a 43.00 pack-year smoking history. She has never used smokeless tobacco. She reports current alcohol use. She reports that she does not use drugs. Family History:  Family History  Problem Relation Age of Onset   Cancer Mother 37       colon cancer   Hypertension Mother    Hyperlipidemia Mother    Cancer Father        prostate cancer, throat cancer   Heart disease Father 108       CABG/CAD; CHF     HOME MEDICATIONS: Allergies as of 11/02/2020       Reactions   Bee Venom Anaphylaxis   Erythromycin Anaphylaxis, Nausea Only   Codeine Nausea Only   Adhesive [tape] Rash   "From bandaids"        Medication List        Accurate as of November 02, 2020  8:13 AM. If you have any questions, ask your nurse or doctor.          STOP taking these medications    nicotine 14 mg/24hr patch Commonly known as: NICODERM CQ - dosed in mg/24 hours Stopped by: Scarlette Shorts, MD  TAKE these medications    albuterol 108 (90 Base) MCG/ACT inhaler Commonly known as: VENTOLIN HFA Inhale 1-2 puffs into the lungs every 6 (six) hours as needed for wheezing.   amLODipine 5 MG tablet Commonly known as: NORVASC TAKE 1 TABLET(5 MG) BY MOUTH DAILY   atorvastatin 40 MG tablet Commonly known as: LIPITOR Take 1 tablet (40 mg total) by mouth daily.   celecoxib 200 MG capsule Commonly known as: CELEBREX Take 200 mg by mouth daily as needed for mild pain.   escitalopram 20 MG tablet Commonly known as: LEXAPRO Take 20 mg by mouth daily.   estradiol 0.5 MG tablet Commonly known as: ESTRACE Take 1 tablet (0.5 mg total) by mouth daily. What changed: how much to take   Fluticasone-Salmeterol 100-50 MCG/DOSE Aepb Commonly known as: ADVAIR Inhale 1 puff into the lungs daily as needed (Shortness of breath).   HYDROcodone-acetaminophen 7.5-325 MG  tablet Commonly known as: NORCO Take 1-2 tablets by mouth every 4 (four) hours as needed for moderate pain.   lisinopril 20 MG tablet Commonly known as: ZESTRIL Take 1 tablet (20 mg total) by mouth daily.   montelukast 10 MG tablet Commonly known as: SINGULAIR Take 1 tablet (10 mg total) by mouth at bedtime.          OBJECTIVE:   PHYSICAL EXAM: VS: BP 126/74 (BP Location: Left Arm, Patient Position: Sitting, Cuff Size: Small)   Pulse 66   Ht 5\' 5"  (1.651 m)   Wt 178 lb 3.2 oz (80.8 kg)   SpO2 96%   BMI 29.65 kg/m    EXAM: General: Pt appears well and is in NAD  Lungs: Clear with good BS bilat with no rales, rhonchi, or wheezes  Heart: Auscultation: RRR.  Abdomen: Normoactive bowel sounds, soft, nontender, without masses or organomegaly palpable  Extremities:  BL LE: No pretibial edema normal ROM and strength.  Mental Status: Judgment, insight: Intact Orientation: Oriented to time, place, and person Mood and affect: No depression, anxiety, or agitation     DATA REVIEWED: Results for Mercedes Lucas" (MRN Mercedes Lucas) as of 11/03/2020 13:35  Ref. Range 11/02/2020 08:33  Sodium Latest Ref Range: 135 - 145 mEq/L 139  Potassium Latest Ref Range: 3.5 - 5.1 mEq/L 4.0  Chloride Latest Ref Range: 96 - 112 mEq/L 105  CO2 Latest Ref Range: 19 - 32 mEq/L 27  Glucose Latest Ref Range: 70 - 99 mg/dL 11/04/2020 (H)  BUN Latest Ref Range: 6 - 23 mg/dL 14  Creatinine Latest Ref Range: 0.40 - 1.20 mg/dL 409  Calcium Latest Ref Range: 8.4 - 10.5 mg/dL 8.11 (H)  Calcium Ionized Latest Ref Range: 4.8 - 5.6 mg/dL 91.4 (H)  Albumin Latest Ref Range: 3.5 - 5.2 g/dL 4.3  GFR Latest Ref Range: >60.00 mL/min 90.63  VITD Latest Ref Range: 30.00 - 100.00 ng/mL 41.90    05/09/2020 Serum calcium 10.7 mg/DL PTH 63 PG/mL 05/11/2020 N5A Corrected calcium 10.76  ASSESSMENT / PLAN / RECOMMENDATIONS:   Hyperparathyroidism:  -Patient is asymptomatic -She has avoided OTC calcium tablets, she  has maintained proper dietary calcium intake -KUB demonstrated a tiny punctate stone versus bowel contents projects over the right Kidney 08/2020 -DXA showed low bone density 09/2020 -We will proceed with 24-hour urine collection for calcium excretion    Recommendation  - STAY hydrated  - Consume 2-3 servings of low fat dairy daily     Follow-up in 6 months   Signed electronically by: 10/2020, MD  [facility]  [facility] [address] [place], [place] [address] Phone: [phone] FAX: [phone]      CC: Patient, No Pcp Per (Inactive) No address on file Phone: None  Fax: None   Return to Endocrinology clinic as below: No future appointments.

## 2020-11-02 NOTE — Patient Instructions (Addendum)
-   STAY hydrated  - Consume 2-3 servings of low fat dairy daily  - Avoid over the counter calcium tablets   Find drinks that have Zero sodium ( Na) and Zero Calcium ( Ca)    24-Hour Urine Collection  You will be collecting your urine for a 24-hour period of time. Your timer starts with your first urine of the morning (For example - If you first pee at 9AM, your timer will start at 9AM) Throw away your first urine of the morning Collect your urine every time you pee for the next 24 hours STOP your urine collection 24 hours after you started the collection (For example - You would stop at 9AM the day after you started)

## 2020-11-03 LAB — PARATHYROID HORMONE, INTACT (NO CA): PTH: 71 pg/mL (ref 16–77)

## 2020-11-03 LAB — CALCIUM, IONIZED: Calcium, Ion: 5.74 mg/dL — ABNORMAL HIGH (ref 4.8–5.6)

## 2020-11-16 DIAGNOSIS — Z96652 Presence of left artificial knee joint: Secondary | ICD-10-CM | POA: Diagnosis not present

## 2020-11-16 DIAGNOSIS — M1711 Unilateral primary osteoarthritis, right knee: Secondary | ICD-10-CM | POA: Diagnosis not present

## 2020-12-07 DIAGNOSIS — M1711 Unilateral primary osteoarthritis, right knee: Secondary | ICD-10-CM | POA: Diagnosis not present

## 2020-12-28 DIAGNOSIS — I10 Essential (primary) hypertension: Secondary | ICD-10-CM | POA: Diagnosis not present

## 2020-12-28 DIAGNOSIS — M1711 Unilateral primary osteoarthritis, right knee: Secondary | ICD-10-CM | POA: Diagnosis not present

## 2020-12-28 DIAGNOSIS — Z1211 Encounter for screening for malignant neoplasm of colon: Secondary | ICD-10-CM | POA: Diagnosis not present

## 2020-12-28 DIAGNOSIS — M25561 Pain in right knee: Secondary | ICD-10-CM | POA: Diagnosis not present

## 2020-12-29 ENCOUNTER — Other Ambulatory Visit: Payer: Self-pay | Admitting: Family Medicine

## 2020-12-29 ENCOUNTER — Other Ambulatory Visit: Payer: Self-pay

## 2020-12-29 ENCOUNTER — Ambulatory Visit
Admission: RE | Admit: 2020-12-29 | Discharge: 2020-12-29 | Disposition: A | Discharge status: Home / Self Care | Payer: Medicare Other | Source: Ambulatory Visit | Attending: Family Medicine | Admitting: Family Medicine

## 2020-12-29 DIAGNOSIS — Z01818 Encounter for other preprocedural examination: Secondary | ICD-10-CM

## 2021-01-09 NOTE — Patient Instructions (Addendum)
DUE TO COVID-19 ONLY ONE VISITOR IS ALLOWED TO COME WITH YOU AND STAY IN THE WAITING ROOM ONLY DURING PRE OP AND PROCEDURE.   **NO VISITORS ARE ALLOWED IN THE SHORT STAY AREA OR RECOVERY ROOM!!**  IF YOU WILL BE ADMITTED INTO THE HOSPITAL YOU ARE ALLOWED ONLY TWO SUPPORT PEOPLE DURING VISITATION HOURS ONLY (7 AM -8PM)    Up to two visitors ages 71+ are allowed at one time in a patient's room.  The visitors may rotate out with other people throughout the day.  Additionally, up to two children between the ages of 71 and 19 are allowed and do not count toward the number of allowed visitors.  Children within this age range must be accompanied by an adult visitor.  One adult visitor may remain with the patient overnight and must be in the room by 8 PM.  COVID SWAB TESTING MUST BE COMPLETED ON:  Friday, 01-20-21, Between the hours of 8 and 3  **MUST PRESENT COMPLETED FORM AT TESTING SITE**    [address] [place] (backside of the building)  You are not required to quarantine, however you are required to wear a well-fitted mask when you are out and around people not in your household.  Hand Hygiene often Do NOT share personal items Notify your provider if you are in close contact with someone who has COVID or you develop fever 100.4 or greater, new onset of sneezing, cough, sore throat, shortness of breath or body aches.        Your procedure is scheduled on:  Tuesday, 01-24-21   Report to [facility] Main  Entrance     Report to admitting at 7:15 AM   Call this number if you have problems the morning of surgery [phone]   Do not eat food :After Midnight.   May have liquids until 7:00 AM day of surgery  CLEAR LIQUID DIET  Foods Allowed                                                                     Foods Excluded  Water, Black Coffee (no milk/no creamer) and tea, regular and decaf                              liquids that you cannot  Plain Jell-O in any  flavor  (No red)                         see through such as: Fruit ices (not with fruit pulp)                                 milk, soups, orange juice  Iced Popsicles (No red)                                    All solid food                             Apple juices  Sports drinks like Gatorade (No red) Lightly seasoned clear broth or consume(fat free) Sugar    Complete one G2 drink the morning of surgery at 7:00 AM the day of surgery.    The day of surgery:  Drink ONE (1) Pre-Surgery G2 the morning of surgery. Drink in one sitting. Do not sip.  This drink was given to you during your hospital  pre-op appointment visit. Nothing else to drink after completing the Pre-Surgery G2          If you have questions, please contact your surgeon's office.     Oral Hygiene is also important to reduce your risk of infection.                                    Remember - BRUSH YOUR TEETH THE MORNING OF SURGERY WITH YOUR REGULAR TOOTHPASTE   Do NOT smoke after Midnight   Take these medicines the morning of surgery with A SIP OF WATER:  Amlodipine, Atorvastatin, Lexpro   Stop all vitamins and herbal supplements a week before surgery             You may not have any metal on your body including hair pins, jewelry, and body piercing             Do not wear make-up, lotions, powders, perfumes cologne, or deodorant  Do not wear nail polish including gel and S&S, artificial/acrylic nails, or any other type of covering on natural nails including finger and toenails. If you have artificial nails, gel coating, etc. that needs to be removed by a nail salon please have this removed prior to surgery or surgery may need to be canceled/ delayed if the surgeon/ anesthesia feels like they are unable to be safely monitored.   Do not shave  48 hours prior to surgery.           Do not bring valuables to the hospital. [facility] IS NOT RESPONSIBLE FOR VALUABLES.   Contacts, dentures or bridgework may not be  worn into surgery.   Bring small overnight bag day of surgery.   Please read over the following fact sheets you were given: IF YOU HAVE QUESTIONS ABOUT YOUR PRE OP INSTRUCTIONS PLEASE CALL [phone] [facility] - Preparing for Surgery Before surgery, you can play an important role.  Because skin is not sterile, your skin needs to be as free of germs as possible.  You can reduce the number of germs on your skin by washing with CHG (chlorahexidine gluconate) soap before surgery.  CHG is an antiseptic cleaner which kills germs and bonds with the skin to continue killing germs even after washing. Please DO NOT use if you have an allergy to CHG or antibacterial soaps.  If your skin becomes reddened/irritated stop using the CHG and inform your nurse when you arrive at Short Stay. Do not shave (including legs and underarms) for at least 48 hours prior to the first CHG shower.  You may shave your face/neck.  Please follow these instructions carefully:  1.  Shower with CHG Soap the night before surgery and the  morning of surgery.  2.  If you choose to wash your hair, wash your hair first as usual with your normal  shampoo.  3.  After you shampoo, rinse your hair and body thoroughly to remove the shampoo.  4.  Use CHG as you would any other liquid soap.  You can apply chg directly to the skin and wash.  Gently with a scrungie or clean washcloth.  5.  Apply the CHG Soap to your body ONLY FROM THE NECK DOWN.   Do   not use on face/ open                           Wound or open sores. Avoid contact with eyes, ears mouth and   genitals (private parts).                       Wash face,  Genitals (private parts) with your normal soap.             6.  Wash thoroughly, paying special attention to the area where your    surgery  will be performed.  7.  Thoroughly rinse your body with warm water from the neck down.  8.  DO NOT shower/wash with your normal soap after using and  rinsing off the CHG Soap.                9.  Pat yourself dry with a clean towel.            10.  Wear clean pajamas.            11.  Place clean sheets on your bed the night of your first shower and do not  sleep with pets. Day of Surgery : Do not apply any lotions/deodorants the morning of surgery.  Please wear clean clothes to the hospital/surgery center.  FAILURE TO FOLLOW THESE INSTRUCTIONS MAY RESULT IN THE CANCELLATION OF YOUR SURGERY  PATIENT SIGNATURE_________________________________  NURSE SIGNATURE__________________________________  ________________________________________________________________________   Mercedes Lucas  An incentive spirometer is a tool that can help keep your lungs clear and active. This tool measures how well you are filling your lungs with each breath. Taking long deep breaths may help reverse or decrease the chance of developing breathing (pulmonary) problems (especially infection) following: A long period of time when you are unable to move or be active. BEFORE THE PROCEDURE  If the spirometer includes an indicator to show your best effort, your nurse or respiratory therapist will set it to a desired goal. If possible, sit up straight or lean slightly forward. Try not to slouch. Hold the incentive spirometer in an upright position. INSTRUCTIONS FOR USE  Sit on the edge of your bed if possible, or sit up as far as you can in bed or on a chair. Hold the incentive spirometer in an upright position. Breathe out normally. Place the mouthpiece in your mouth and seal your lips tightly around it. Breathe in slowly and as deeply as possible, raising the piston or the ball toward the top of the column. Hold your breath for 3-5 seconds or for as long as possible. Allow the piston or ball to fall to the bottom of the column. Remove the mouthpiece from your mouth and breathe out normally. Rest for a few seconds and repeat Steps 1 through 7 at least 10 times  every 1-2 hours when you are awake. Take your time and take a few normal breaths between deep breaths. The spirometer may include an indicator to show your best effort. Use the indicator as a goal to work toward during each repetition. After each set of 10 deep breaths, practice coughing to be sure  your lungs are clear. If you have an incision (the cut made at the time of surgery), support your incision when coughing by placing a pillow or rolled up towels firmly against it. Once you are able to get out of bed, walk around indoors and cough well. You may stop using the incentive spirometer when instructed by your caregiver.  RISKS AND COMPLICATIONS Take your time so you do not get dizzy or light-headed. If you are in pain, you may need to take or ask for pain medication before doing incentive spirometry. It is harder to take a deep breath if you are having pain. AFTER USE Rest and breathe slowly and easily. It can be helpful to keep track of a log of your progress. Your caregiver can provide you with a simple table to help with this. If you are using the spirometer at home, follow these instructions: [place] IF:  You are having difficultly using the spirometer. You have trouble using the spirometer as often as instructed. Your pain medication is not giving enough relief while using the spirometer. You develop fever of 100.5 F (38.1 C) or higher. SEEK IMMEDIATE MEDICAL CARE IF:  You cough up bloody sputum that had not been present before. You develop fever of 102 F (38.9 C) or greater. You develop worsening pain at or near the incision site. MAKE SURE YOU:  Understand these instructions. Will watch your condition. Will get help right away if you are not doing well or get worse. Document Released: 06/11/2006 Document Revised: 04/23/2011 Document Reviewed: 08/12/2006 Exit[employer] Patient Information 2014 ExitCare,  [place].   ________________________________________________________________________  WHAT IS A BLOOD TRANSFUSION? Blood Transfusion Information  A transfusion is the replacement of blood or some of its parts. Blood is made up of multiple cells which provide different functions. Red blood cells carry oxygen and are used for blood loss replacement. White blood cells fight against infection. Platelets control bleeding. Plasma helps clot blood. Other blood products are available for specialized needs, such as hemophilia or other clotting disorders. BEFORE THE TRANSFUSION  Who gives blood for transfusions?  Healthy volunteers who are fully evaluated to make sure their blood is safe. This is blood bank blood. Transfusion therapy is the safest it has ever been in the practice of medicine. Before blood is taken from a donor, a complete history is taken to make sure that person has no history of diseases nor engages in risky social behavior (examples are intravenous drug use or sexual activity with multiple partners). The donor's travel history is screened to minimize risk of transmitting infections, such as malaria. The donated blood is tested for signs of infectious diseases, such as HIV and hepatitis. The blood is then tested to be sure it is compatible with you in order to minimize the chance of a transfusion reaction. If you or a relative donates blood, this is often done in anticipation of surgery and is not appropriate for emergency situations. It takes many days to process the donated blood. RISKS AND COMPLICATIONS Although transfusion therapy is very safe and saves many lives, the main dangers of transfusion include:  Getting an infectious disease. Developing a transfusion reaction. This is an allergic reaction to something in the blood you were given. Every precaution is taken to prevent this. The decision to have a blood transfusion has been considered carefully by your caregiver before blood is  given. Blood is not given unless the benefits outweigh the risks. AFTER THE TRANSFUSION Right after receiving a blood  transfusion, you will usually feel much better and more energetic. This is especially true if your red blood cells have gotten low (anemic). The transfusion raises the level of the red blood cells which carry oxygen, and this usually causes an energy increase. The nurse administering the transfusion will monitor you carefully for complications. HOME CARE INSTRUCTIONS  No special instructions are needed after a transfusion. You may find your energy is better. Speak with your caregiver about any limitations on activity for underlying diseases you may have. SEEK MEDICAL CARE IF:  Your condition is not improving after your transfusion. You develop redness or irritation at the intravenous (IV) site. SEEK IMMEDIATE MEDICAL CARE IF:  Any of the following symptoms occur over the next 12 hours: Shaking chills. You have a temperature by mouth above 102 F (38.9 C), not controlled by medicine. Chest, back, or muscle pain. People around you feel you are not acting correctly or are confused. Shortness of breath or difficulty breathing. Dizziness and fainting. You get a rash or develop hives. You have a decrease in urine output. Your urine turns a dark color or changes to pink, red, or brown. Any of the following symptoms occur over the next 10 days: You have a temperature by mouth above 102 F (38.9 C), not controlled by medicine. Shortness of breath. Weakness after normal activity. The white part of the eye turns yellow (jaundice). You have a decrease in the amount of urine or are urinating less often. Your urine turns a dark color or changes to pink, red, or brown. Document Released: 01/27/2000 Document Revised: 04/23/2011 Document Reviewed: 09/15/2007 [facility] Patient Information 2014 [place], [place].  _______________________________________________________________________

## 2021-01-10 NOTE — Progress Notes (Addendum)
COVID swab appointment: 01-20-21  COVID Vaccine Completed: Yes x2 Date COVID Vaccine completed: 03-21-19 04-15-19 Has received booster:  No COVID vaccine manufacturer: Pfizer    [employer]   Date of COVID positive in last 90 days:  No  PCP - Horton Marshall, PA Cardiologist -  N/A  Medical clearance on chart from Horton Marshall, PA dated 12-30-20  Chest x-ray - 12-29-20 EKG - 12-28-20 on chart Stress Test - greater 10 years ago for asthma ECHO - N/A Cardiac Cath - N/A Pacemaker/ICD device last checked: Spinal Cord Stimulator:  Sleep Study - N/A CPAP -   Fasting Blood Sugar - N/A Checks Blood Sugar _____ times a day  Blood Thinner Instructions: Aspirin Instructions: Last Dose:  Activity level:  Can go up a flight of stairs and perform activities of daily living without stopping and without symptoms of chest pain or shortness of breath.    Anesthesia review:  BP 165/91 and on recheck 169/86.  Patient started on Hydrochlorothiazide on 01/11/21 and is scheduled for BP check at PCP on 01-20-21.  Patient denies shortness of breath, fever, cough and chest pain at PAT appointment   Patient verbalized understanding of instructions that were given to them at the PAT appointment. Patient was also instructed that they will need to review over the PAT instructions again at home before surgery.

## 2021-01-13 ENCOUNTER — Other Ambulatory Visit: Payer: Self-pay

## 2021-01-13 ENCOUNTER — Encounter (HOSPITAL_COMMUNITY)
Admission: RE | Admit: 2021-01-13 | Discharge: 2021-01-13 | Disposition: A | Discharge status: Home / Self Care | Payer: Medicare Other | Source: Ambulatory Visit | Attending: Orthopedic Surgery | Admitting: Orthopedic Surgery

## 2021-01-13 ENCOUNTER — Encounter (HOSPITAL_COMMUNITY): Payer: Self-pay

## 2021-01-13 VITALS — BP 169/86 | HR 68 | Temp 98.1°F | Resp 18 | Ht 65.0 in | Wt 172.8 lb

## 2021-01-13 DIAGNOSIS — Z01812 Encounter for preprocedural laboratory examination: Secondary | ICD-10-CM | POA: Diagnosis not present

## 2021-01-13 DIAGNOSIS — Z01818 Encounter for other preprocedural examination: Secondary | ICD-10-CM

## 2021-01-13 DIAGNOSIS — M1711 Unilateral primary osteoarthritis, right knee: Secondary | ICD-10-CM | POA: Diagnosis not present

## 2021-01-13 HISTORY — DX: Anemia, unspecified: D64.9

## 2021-01-13 HISTORY — DX: Other specified postprocedural states: Z98.890

## 2021-01-13 HISTORY — DX: Prediabetes: R73.03

## 2021-01-13 HISTORY — DX: Pneumonia, unspecified organism: J18.9

## 2021-01-13 HISTORY — DX: Nausea with vomiting, unspecified: R11.2

## 2021-01-13 LAB — TYPE AND SCREEN
ABO/RH(D): A POS
Antibody Screen: NEGATIVE

## 2021-01-13 LAB — SURGICAL PCR SCREEN
MRSA, PCR: NEGATIVE
Staphylococcus aureus: POSITIVE — AB

## 2021-01-13 LAB — GLUCOSE, CAPILLARY: Glucose-Capillary: 125 mg/dL — ABNORMAL HIGH (ref 70–99)

## 2021-01-13 NOTE — Progress Notes (Signed)
PCR results sent to Dr. Olin to review.   

## 2021-01-19 NOTE — H&P (Signed)
TOTAL KNEE ADMISSION H&P  Patient is being admitted for right total knee arthroplasty.  Subjective:  Chief Complaint:right knee pain.  HPI: [place], 65 y.o. female, has a history of pain and functional disability in the right knee due to arthritis and has failed non-surgical conservative treatments for greater than 12 weeks to includeNSAID's and/or analgesics, corticosteriod injections, and activity modification.  Onset of symptoms was gradual, starting 8 years ago with gradually worsening course since that time. The patient noted no past surgery on the right knee(s).  Patient currently rates pain in the right knee(s) at 7 out of 10 with activity. Patient has worsening of pain with activity and weight bearing, pain that interferes with activities of daily living, and pain with passive range of motion.  Patient has evidence of joint space narrowing by imaging studies.  There is no active infection.  Patient Active Problem List   Diagnosis Date Noted   Essential hypertension 12/27/2014   Hyperlipidemia 12/27/2014   Asthma, mild intermittent 12/27/2014   Hot flashes 12/27/2014   Environmental allergies 12/27/2014   Tobacco abuse 12/27/2014   Overweight (BMI 25.0-29.9) 06/02/2014   S/P left TKA 06/01/2014   Past Medical History:  Diagnosis Date   Allergy    Anemia    Arthritis    Asthma    "mild-no attacks"   Headache    migraines occasional   Hypercholesteremia    Hypertension    Pneumonia    PONV (postoperative nausea and vomiting)    Pre-diabetes     Past Surgical History:  Procedure Laterality Date   ABDOMINAL HYSTERECTOMY  1989   ANKLE SURGERY  2005   APPENDECTOMY  2000   BLADDER SURGERY  2005   bladder stretch   bladder tact  1997   BREAST SURGERY Left 1987   biopsy   CHOLECYSTECTOMY  2000   COLONOSCOPY  2007   ELBOW SURGERY Left 2003   "damaged funny bone"   LAPAROSCOPY  1992   MENISCUS REPAIR  2012   TONSILLECTOMY  30's   TOTAL KNEE ARTHROPLASTY Left  06/01/2014   Procedure: LEFT TOTAL KNEE ARTHROPLASTY;  Surgeon: Durene Romans, MD;  Location: [facility];  Service: Orthopedics;  Laterality: Left;   TUBAL LIGATION  1987    No current facility-administered medications for this encounter.   Current Outpatient Medications  Medication Sig Dispense Refill Last Dose   albuterol (PROVENTIL HFA;VENTOLIN HFA) 108 (90 BASE) MCG/ACT inhaler Inhale 1-2 puffs into the lungs every 6 (six) hours as needed for wheezing. 1 Inhaler 0    amLODipine (NORVASC) 5 MG tablet TAKE 1 TABLET(5 MG) BY MOUTH DAILY 90 tablet 1    atorvastatin (LIPITOR) 40 MG tablet Take 1 tablet (40 mg total) by mouth daily. (Patient taking differently: Take 40 mg by mouth at bedtime.) 90 tablet 1    escitalopram (LEXAPRO) 20 MG tablet Take 20 mg by mouth daily. Noon      estradiol (ESTRACE) 2 MG tablet Take 2 mg by mouth daily.      Fluticasone-Salmeterol (ADVAIR) 100-50 MCG/DOSE AEPB Inhale 1 puff into the lungs daily as needed (Shortness of breath). 60 each 3    lisinopril (ZESTRIL) 40 MG tablet Take 40 mg by mouth daily.      meloxicam (MOBIC) 15 MG tablet Take 15 mg by mouth daily.      metFORMIN (GLUCOPHAGE) 500 MG tablet Take 500 mg by mouth daily with breakfast.      montelukast (SINGULAIR) 10 MG tablet Take 1  tablet (10 mg total) by mouth at bedtime. 90 tablet 3    hydrochlorothiazide (HYDRODIURIL) 12.5 MG tablet Take 12.5 mg by mouth daily.      Allergies  Allergen Reactions   Bee Venom Anaphylaxis   Erythromycin Anaphylaxis and Nausea Only   Codeine Nausea Only   Adhesive [Tape] Rash    "From bandaids"     Social History   Tobacco Use   Smoking status: Every Day    Packs/day: 1.00    Years: 43.00    Pack years: 43.00    Types: Cigarettes   Smokeless tobacco: Never  Substance Use Topics   Alcohol use: Yes    Comment: rare    Family History  Problem Relation Age of Onset   Cancer Mother 75       colon cancer   Hypertension Mother    Hyperlipidemia Mother     Cancer Father        prostate cancer, throat cancer   Heart disease Father 90       CABG/CAD; CHF     Review of Systems  Constitutional:  Negative for chills and fever.  Respiratory:  Negative for cough and shortness of breath.   Cardiovascular:  Negative for chest pain.  Gastrointestinal:  Negative for nausea and vomiting.  Musculoskeletal:  Positive for arthralgias.    Objective:  Physical Exam Well nourished and well developed. General: Alert and oriented x3, cooperative and pleasant, no acute distress. Head: normocephalic, atraumatic, neck supple. Eyes: EOMI.  Musculoskeletal: Right knee exam: No palpable effusion, warmth or erythema Tenderness mainly over the medial aspect of the joint Stable and intact medial lateral collateral ligaments Slight flexion contracture with flexion to 120 degrees with noted tightness   Calves soft and nontender. Motor function intact in LE. Strength 5/5 LE bilaterally. Neuro: Distal pulses 2+. Sensation to light touch intact in LE.  Vital signs in last 24 hours:    Labs:   Estimated body mass index is 28.76 kg/m as calculated from the following:   Height as of 01/13/21: 5\' 5"  (1.651 m).   Weight as of 01/13/21: 78.4 kg.   Imaging Review Plain radiographs demonstrate severe degenerative joint disease of the right knee(s). The overall alignment isneutral. The bone quality appears to be adequate for age and reported activity level.      Assessment/Plan:  End stage arthritis, right knee   The patient history, physical examination, clinical judgment of the provider and imaging studies are consistent with end stage degenerative joint disease of the right knee(s) and total knee arthroplasty is deemed medically necessary. The treatment options including medical management, injection therapy arthroscopy and arthroplasty were discussed at length. The risks and benefits of total knee arthroplasty were presented and reviewed. The risks due  to aseptic loosening, infection, stiffness, patella tracking problems, thromboembolic complications and other imponderables were discussed. The patient acknowledged the explanation, agreed to proceed with the plan and consent was signed. Patient is being admitted for inpatient treatment for surgery, pain control, PT, OT, prophylactic antibiotics, VTE prophylaxis, progressive ambulation and ADL's and discharge planning. The patient is planning to be discharged  home.  Therapy Plans: outpatient therapy at [facility] Disposition: Home with husband Planned DVT Prophylaxis: aspirin 81mg  BID DME needed: none PCP: 14/2/22, PA-C - clearance received TXA: IV Allergies: codeine - nausea, adhesives - rash, bee venom - anaphylaxis, erythryomycin - nausea Anesthesia Concerns: none BMI: 29.1 Last HgbA1c: check a1c  Other: - Norco vs Hydromorphone, celebrex, tylenol, toradol -  concerned about pain control - Keflex? smoker 1 ppd, (needs diflucan with it)    Patient's anticipated LOS is less than 2 midnights, meeting these requirements: - Younger than 65 - Lives within 1 hour of care - Has a competent adult at home to recover with post-op recover - NO history of  - Chronic pain requiring opiods  - Diabetes  - Coronary Artery Disease  - Heart failure  - Heart attack  - Stroke  - DVT/VTE  - Cardiac arrhythmia  - Respiratory Failure/COPD  - Renal failure  - Anemia  - Advanced Liver disease  Rosalene Billings, PA-C Orthopedic Surgery [facility] [place] [phone]

## 2021-01-20 ENCOUNTER — Other Ambulatory Visit: Payer: Self-pay | Admitting: Orthopedic Surgery

## 2021-01-20 LAB — SARS CORONAVIRUS 2 (TAT 6-24 HRS): SARS Coronavirus 2: NEGATIVE

## 2021-01-24 ENCOUNTER — Encounter (HOSPITAL_COMMUNITY): Payer: Self-pay | Admitting: Orthopedic Surgery

## 2021-01-24 ENCOUNTER — Other Ambulatory Visit: Payer: Self-pay

## 2021-01-24 ENCOUNTER — Ambulatory Visit (HOSPITAL_COMMUNITY): Payer: Medicare Other | Admitting: Certified Registered"

## 2021-01-24 ENCOUNTER — Encounter (HOSPITAL_COMMUNITY)
Admission: RE | Disposition: A | Discharge status: Home / Self Care | Payer: Self-pay | Source: Ambulatory Visit | Attending: Orthopedic Surgery

## 2021-01-24 ENCOUNTER — Inpatient Hospital Stay (HOSPITAL_COMMUNITY)
Admission: RE | Admit: 2021-01-24 | Discharge: 2021-01-27 | DRG: 470 | Disposition: A | Discharge status: Home / Self Care | Payer: Medicare Other | Source: Ambulatory Visit | Attending: Orthopedic Surgery | Admitting: Orthopedic Surgery

## 2021-01-24 DIAGNOSIS — Z885 Allergy status to narcotic agent status: Secondary | ICD-10-CM | POA: Diagnosis not present

## 2021-01-24 DIAGNOSIS — Z7984 Long term (current) use of oral hypoglycemic drugs: Secondary | ICD-10-CM

## 2021-01-24 DIAGNOSIS — M25761 Osteophyte, right knee: Secondary | ICD-10-CM | POA: Diagnosis not present

## 2021-01-24 DIAGNOSIS — Z91030 Bee allergy status: Secondary | ICD-10-CM | POA: Diagnosis not present

## 2021-01-24 DIAGNOSIS — Z881 Allergy status to other antibiotic agents status: Secondary | ICD-10-CM | POA: Diagnosis not present

## 2021-01-24 DIAGNOSIS — Z79890 Hormone replacement therapy (postmenopausal): Secondary | ICD-10-CM | POA: Diagnosis not present

## 2021-01-24 DIAGNOSIS — Z7951 Long term (current) use of inhaled steroids: Secondary | ICD-10-CM | POA: Diagnosis not present

## 2021-01-24 DIAGNOSIS — M1711 Unilateral primary osteoarthritis, right knee: Principal | ICD-10-CM | POA: Diagnosis present

## 2021-01-24 DIAGNOSIS — Z79899 Other long term (current) drug therapy: Secondary | ICD-10-CM

## 2021-01-24 DIAGNOSIS — G8918 Other acute postprocedural pain: Secondary | ICD-10-CM | POA: Diagnosis not present

## 2021-01-24 DIAGNOSIS — Z888 Allergy status to other drugs, medicaments and biological substances status: Secondary | ICD-10-CM

## 2021-01-24 DIAGNOSIS — L299 Pruritus, unspecified: Secondary | ICD-10-CM | POA: Diagnosis not present

## 2021-01-24 DIAGNOSIS — I10 Essential (primary) hypertension: Secondary | ICD-10-CM | POA: Diagnosis not present

## 2021-01-24 DIAGNOSIS — Z96652 Presence of left artificial knee joint: Secondary | ICD-10-CM | POA: Diagnosis not present

## 2021-01-24 DIAGNOSIS — J4520 Mild intermittent asthma, uncomplicated: Secondary | ICD-10-CM | POA: Diagnosis present

## 2021-01-24 DIAGNOSIS — M659 Synovitis and tenosynovitis, unspecified: Secondary | ICD-10-CM | POA: Diagnosis not present

## 2021-01-24 DIAGNOSIS — E7800 Pure hypercholesterolemia, unspecified: Secondary | ICD-10-CM | POA: Diagnosis not present

## 2021-01-24 DIAGNOSIS — Z96651 Presence of right artificial knee joint: Secondary | ICD-10-CM

## 2021-01-24 DIAGNOSIS — Z791 Long term (current) use of non-steroidal anti-inflammatories (NSAID): Secondary | ICD-10-CM

## 2021-01-24 DIAGNOSIS — F17210 Nicotine dependence, cigarettes, uncomplicated: Secondary | ICD-10-CM | POA: Diagnosis present

## 2021-01-24 HISTORY — PX: TOTAL KNEE ARTHROPLASTY: SHX125

## 2021-01-24 SURGERY — ARTHROPLASTY, KNEE, TOTAL
Anesthesia: Monitor Anesthesia Care | Site: Knee | Laterality: Right

## 2021-01-24 MED ORDER — SODIUM CHLORIDE (PF) 0.9 % IJ SOLN
INTRAMUSCULAR | Status: DC | PRN
  Administered 2021-01-24: 30 mL

## 2021-01-24 MED ORDER — CHLORHEXIDINE GLUCONATE 0.12 % MT SOLN: 15.0000 mL | Freq: Once | OROMUCOSAL | Status: DC

## 2021-01-24 MED ORDER — HYDROMORPHONE HCL 1 MG/ML IJ SOLN
0.5000 mg | INTRAMUSCULAR | Status: DC | PRN
  Administered 2021-01-24: 0.5 mg via INTRAVENOUS
  Administered 2021-01-25: 1 mg via INTRAVENOUS
  Filled 2021-01-24 (×2): qty 1

## 2021-01-24 MED ORDER — PHENYLEPHRINE HCL-NACL 20-0.9 MG/250ML-% IV SOLN
INTRAVENOUS | Status: DC | PRN
  Administered 2021-01-24: 20 ug/min via INTRAVENOUS

## 2021-01-24 MED ORDER — FENTANYL CITRATE PF 50 MCG/ML IJ SOSY: 25.0000 ug | PREFILLED_SYRINGE | INTRAMUSCULAR | Status: DC | PRN

## 2021-01-24 MED ORDER — STERILE WATER FOR IRRIGATION IR SOLN
Status: DC | PRN
  Administered 2021-01-24: 2000 mL

## 2021-01-24 MED ORDER — PHENYLEPHRINE HCL (PRESSORS) 10 MG/ML IV SOLN
INTRAVENOUS | Status: AC
  Filled 2021-01-24: qty 1

## 2021-01-24 MED ORDER — MENTHOL 3 MG MT LOZG: 1.0000 | LOZENGE | OROMUCOSAL | Status: DC | PRN

## 2021-01-24 MED ORDER — ONDANSETRON HCL 4 MG/2ML IJ SOLN
INTRAMUSCULAR | Status: AC
  Filled 2021-01-24: qty 2

## 2021-01-24 MED ORDER — ONDANSETRON HCL 4 MG/2ML IJ SOLN: 4.0000 mg | Freq: Four times a day (QID) | INTRAMUSCULAR | Status: DC | PRN

## 2021-01-24 MED ORDER — ROPIVACAINE HCL 7.5 MG/ML IJ SOLN
INTRAMUSCULAR | Status: DC | PRN
  Administered 2021-01-24: 20 mL via PERINEURAL

## 2021-01-24 MED ORDER — PROMETHAZINE HCL 25 MG/ML IJ SOLN: 6.2500 mg | INTRAMUSCULAR | Status: DC | PRN

## 2021-01-24 MED ORDER — METFORMIN HCL 500 MG PO TABS
500.0000 mg | ORAL_TABLET | Freq: Every day | ORAL | Status: DC
  Administered 2021-01-25 – 2021-01-27 (×3): 500 mg via ORAL
  Filled 2021-01-24 (×3): qty 1

## 2021-01-24 MED ORDER — FERROUS SULFATE 325 (65 FE) MG PO TABS
325.0000 mg | ORAL_TABLET | Freq: Three times a day (TID) | ORAL | Status: DC
  Administered 2021-01-25 – 2021-01-27 (×4): 325 mg via ORAL
  Filled 2021-01-24 (×5): qty 1

## 2021-01-24 MED ORDER — ONDANSETRON HCL 4 MG/2ML IJ SOLN
INTRAMUSCULAR | Status: DC | PRN
  Administered 2021-01-24: 4 mg via INTRAVENOUS

## 2021-01-24 MED ORDER — SODIUM CHLORIDE (PF) 0.9 % IJ SOLN
INTRAMUSCULAR | Status: AC
  Filled 2021-01-24: qty 30

## 2021-01-24 MED ORDER — ONDANSETRON HCL 4 MG PO TABS: 4.0000 mg | ORAL_TABLET | Freq: Four times a day (QID) | ORAL | Status: DC | PRN

## 2021-01-24 MED ORDER — BISACODYL 10 MG RE SUPP: 10.0000 mg | Freq: Every day | RECTAL | Status: DC | PRN

## 2021-01-24 MED ORDER — BUPIVACAINE-EPINEPHRINE (PF) 0.25% -1:200000 IJ SOLN
INTRAMUSCULAR | Status: AC
  Filled 2021-01-24: qty 30

## 2021-01-24 MED ORDER — METOCLOPRAMIDE HCL 5 MG PO TABS: 5.0000 mg | ORAL_TABLET | Freq: Three times a day (TID) | ORAL | Status: DC | PRN

## 2021-01-24 MED ORDER — CLONIDINE HCL (ANALGESIA) 100 MCG/ML EP SOLN
EPIDURAL | Status: DC | PRN
  Administered 2021-01-24: 100 ug

## 2021-01-24 MED ORDER — METHOCARBAMOL 500 MG IVPB - SIMPLE MED
500.0000 mg | Freq: Four times a day (QID) | INTRAVENOUS | Status: DC | PRN
  Filled 2021-01-24: qty 50

## 2021-01-24 MED ORDER — BUPIVACAINE-EPINEPHRINE (PF) 0.25% -1:200000 IJ SOLN
INTRAMUSCULAR | Status: DC | PRN
  Administered 2021-01-24: 30 mL

## 2021-01-24 MED ORDER — DIPHENHYDRAMINE HCL 12.5 MG/5ML PO ELIX
12.5000 mg | ORAL_SOLUTION | ORAL | Status: DC | PRN
  Administered 2021-01-25: 07:00:00 25 mg via ORAL
  Filled 2021-01-24: qty 10

## 2021-01-24 MED ORDER — ACETAMINOPHEN 10 MG/ML IV SOLN: 1000.0000 mg | Freq: Once | INTRAVENOUS | Status: DC | PRN

## 2021-01-24 MED ORDER — MOMETASONE FURO-FORMOTEROL FUM 100-5 MCG/ACT IN AERO
2.0000 | INHALATION_SPRAY | Freq: Two times a day (BID) | RESPIRATORY_TRACT | Status: DC
  Administered 2021-01-24 – 2021-01-27 (×6): 2 via RESPIRATORY_TRACT
  Filled 2021-01-24: qty 8.8

## 2021-01-24 MED ORDER — FENTANYL CITRATE PF 50 MCG/ML IJ SOSY
50.0000 ug | PREFILLED_SYRINGE | INTRAMUSCULAR | Status: DC
  Administered 2021-01-24: 50 ug via INTRAVENOUS
  Filled 2021-01-24: qty 2

## 2021-01-24 MED ORDER — LISINOPRIL 20 MG PO TABS
40.0000 mg | ORAL_TABLET | Freq: Every day | ORAL | Status: DC
  Administered 2021-01-25 – 2021-01-27 (×3): 40 mg via ORAL
  Filled 2021-01-24 (×3): qty 2

## 2021-01-24 MED ORDER — PHENOL 1.4 % MT LIQD: 1.0000 | OROMUCOSAL | Status: DC | PRN

## 2021-01-24 MED ORDER — CELECOXIB 200 MG PO CAPS
200.0000 mg | ORAL_CAPSULE | Freq: Two times a day (BID) | ORAL | Status: DC
  Administered 2021-01-24 – 2021-01-27 (×6): 200 mg via ORAL
  Filled 2021-01-24 (×6): qty 1

## 2021-01-24 MED ORDER — SODIUM CHLORIDE 0.9 % IV SOLN: INTRAVENOUS | Status: DC

## 2021-01-24 MED ORDER — LIDOCAINE 2% (20 MG/ML) 5 ML SYRINGE
INTRAMUSCULAR | Status: DC | PRN
Start: 2021-01-24 — End: 2021-01-24
  Administered 2021-01-24: 40 mg via INTRAVENOUS

## 2021-01-24 MED ORDER — ORAL CARE MOUTH RINSE: 15.0000 mL | Freq: Once | OROMUCOSAL | Status: DC

## 2021-01-24 MED ORDER — DOCUSATE SODIUM 100 MG PO CAPS
100.0000 mg | ORAL_CAPSULE | Freq: Two times a day (BID) | ORAL | Status: DC
  Administered 2021-01-24 – 2021-01-27 (×6): 100 mg via ORAL
  Filled 2021-01-24 (×6): qty 1

## 2021-01-24 MED ORDER — LACTATED RINGERS IV SOLN: INTRAVENOUS | Status: DC

## 2021-01-24 MED ORDER — POVIDONE-IODINE 10 % EX SWAB: 2.0000 "application " | Freq: Once | CUTANEOUS | Status: DC

## 2021-01-24 MED ORDER — PROPOFOL 500 MG/50ML IV EMUL
INTRAVENOUS | Status: DC | PRN
  Administered 2021-01-24: 75 ug/kg/min via INTRAVENOUS

## 2021-01-24 MED ORDER — CEFAZOLIN SODIUM-DEXTROSE 2-4 GM/100ML-% IV SOLN
2.0000 g | INTRAVENOUS | Status: AC
  Administered 2021-01-24: 2 g via INTRAVENOUS
  Filled 2021-01-24: qty 100

## 2021-01-24 MED ORDER — MIDAZOLAM HCL 2 MG/2ML IJ SOLN
1.0000 mg | INTRAMUSCULAR | Status: DC
  Administered 2021-01-24: 2 mg via INTRAVENOUS
  Filled 2021-01-24: qty 2

## 2021-01-24 MED ORDER — PROPOFOL 1000 MG/100ML IV EMUL
INTRAVENOUS | Status: AC
  Filled 2021-01-24: qty 100

## 2021-01-24 MED ORDER — PROPOFOL 10 MG/ML IV BOLUS
INTRAVENOUS | Status: AC
  Filled 2021-01-24: qty 20

## 2021-01-24 MED ORDER — HYDROCHLOROTHIAZIDE 12.5 MG PO TABS
12.5000 mg | ORAL_TABLET | Freq: Every day | ORAL | Status: DC
  Administered 2021-01-25 – 2021-01-27 (×3): 12.5 mg via ORAL
  Filled 2021-01-24 (×3): qty 1

## 2021-01-24 MED ORDER — ACETAMINOPHEN 325 MG PO TABS
325.0000 mg | ORAL_TABLET | Freq: Four times a day (QID) | ORAL | Status: DC | PRN
  Administered 2021-01-26: 650 mg via ORAL
  Filled 2021-01-24: qty 2

## 2021-01-24 MED ORDER — DEXAMETHASONE SODIUM PHOSPHATE 10 MG/ML IJ SOLN
10.0000 mg | Freq: Once | INTRAMUSCULAR | Status: AC
  Administered 2021-01-25: 08:00:00 10 mg via INTRAVENOUS
  Filled 2021-01-24: qty 1

## 2021-01-24 MED ORDER — HYDROMORPHONE HCL 2 MG PO TABS
2.0000 mg | ORAL_TABLET | ORAL | Status: DC | PRN
  Administered 2021-01-24: 2 mg via ORAL
  Administered 2021-01-24 – 2021-01-26 (×8): 4 mg via ORAL
  Administered 2021-01-27: 2 mg via ORAL
  Filled 2021-01-24 (×3): qty 2
  Filled 2021-01-24: qty 1
  Filled 2021-01-24 (×4): qty 2
  Filled 2021-01-24: qty 1
  Filled 2021-01-24: qty 2

## 2021-01-24 MED ORDER — POLYETHYLENE GLYCOL 3350 17 G PO PACK: 17.0000 g | PACK | Freq: Every day | ORAL | Status: DC | PRN

## 2021-01-24 MED ORDER — SODIUM CHLORIDE 0.9 % IR SOLN
Status: DC | PRN
  Administered 2021-01-24: 1000 mL

## 2021-01-24 MED ORDER — ESTRADIOL 1 MG PO TABS
2.0000 mg | ORAL_TABLET | Freq: Every day | ORAL | Status: DC
  Administered 2021-01-25 – 2021-01-27 (×3): 2 mg via ORAL
  Filled 2021-01-24 (×3): qty 2

## 2021-01-24 MED ORDER — TRANEXAMIC ACID-NACL 1000-0.7 MG/100ML-% IV SOLN
1000.0000 mg | Freq: Once | INTRAVENOUS | Status: AC
  Administered 2021-01-24: 1000 mg via INTRAVENOUS
  Filled 2021-01-24: qty 100

## 2021-01-24 MED ORDER — KETOROLAC TROMETHAMINE 15 MG/ML IJ SOLN
7.5000 mg | Freq: Four times a day (QID) | INTRAMUSCULAR | Status: AC
  Administered 2021-01-24 – 2021-01-25 (×4): 7.5 mg via INTRAVENOUS
  Filled 2021-01-24 (×4): qty 1

## 2021-01-24 MED ORDER — AMLODIPINE BESYLATE 5 MG PO TABS
5.0000 mg | ORAL_TABLET | Freq: Every day | ORAL | Status: DC
  Administered 2021-01-25 – 2021-01-27 (×3): 5 mg via ORAL
  Filled 2021-01-24 (×3): qty 1

## 2021-01-24 MED ORDER — ATORVASTATIN CALCIUM 40 MG PO TABS
40.0000 mg | ORAL_TABLET | Freq: Every day | ORAL | Status: DC
  Administered 2021-01-24 – 2021-01-26 (×3): 40 mg via ORAL
  Filled 2021-01-24 (×3): qty 1

## 2021-01-24 MED ORDER — KETOROLAC TROMETHAMINE 30 MG/ML IJ SOLN
INTRAMUSCULAR | Status: AC
  Filled 2021-01-24: qty 1

## 2021-01-24 MED ORDER — KETOROLAC TROMETHAMINE 30 MG/ML IJ SOLN
INTRAMUSCULAR | Status: DC | PRN
  Administered 2021-01-24: 30 mg via INTRAVENOUS

## 2021-01-24 MED ORDER — ESCITALOPRAM OXALATE 20 MG PO TABS
20.0000 mg | ORAL_TABLET | Freq: Every day | ORAL | Status: DC
  Administered 2021-01-25 – 2021-01-27 (×3): 20 mg via ORAL
  Filled 2021-01-24 (×3): qty 1

## 2021-01-24 MED ORDER — CEFAZOLIN SODIUM-DEXTROSE 2-4 GM/100ML-% IV SOLN
2.0000 g | Freq: Four times a day (QID) | INTRAVENOUS | Status: AC
  Administered 2021-01-24 (×2): 2 g via INTRAVENOUS
  Filled 2021-01-24 (×2): qty 100

## 2021-01-24 MED ORDER — DEXAMETHASONE SODIUM PHOSPHATE 10 MG/ML IJ SOLN
8.0000 mg | Freq: Once | INTRAMUSCULAR | Status: AC
  Administered 2021-01-24: 8 mg via INTRAVENOUS

## 2021-01-24 MED ORDER — DEXAMETHASONE SODIUM PHOSPHATE 10 MG/ML IJ SOLN
INTRAMUSCULAR | Status: AC
  Filled 2021-01-24: qty 1

## 2021-01-24 MED ORDER — MIDAZOLAM HCL 2 MG/2ML IJ SOLN
INTRAMUSCULAR | Status: DC | PRN
  Administered 2021-01-24: 1 mg via INTRAVENOUS

## 2021-01-24 MED ORDER — ASPIRIN 81 MG PO CHEW
81.0000 mg | CHEWABLE_TABLET | Freq: Two times a day (BID) | ORAL | Status: DC
  Administered 2021-01-24 – 2021-01-27 (×6): 81 mg via ORAL
  Filled 2021-01-24 (×6): qty 1

## 2021-01-24 MED ORDER — METHOCARBAMOL 500 MG PO TABS
500.0000 mg | ORAL_TABLET | Freq: Four times a day (QID) | ORAL | Status: DC | PRN
  Administered 2021-01-24 – 2021-01-26 (×4): 500 mg via ORAL
  Filled 2021-01-24 (×5): qty 1

## 2021-01-24 MED ORDER — MIDAZOLAM HCL 2 MG/2ML IJ SOLN
INTRAMUSCULAR | Status: AC
  Filled 2021-01-24: qty 2

## 2021-01-24 MED ORDER — METOCLOPRAMIDE HCL 5 MG/ML IJ SOLN: 5.0000 mg | Freq: Three times a day (TID) | INTRAMUSCULAR | Status: DC | PRN

## 2021-01-24 MED ORDER — 0.9 % SODIUM CHLORIDE (POUR BTL) OPTIME
TOPICAL | Status: DC | PRN
  Administered 2021-01-24: 1000 mL

## 2021-01-24 MED ORDER — BUPIVACAINE IN DEXTROSE 0.75-8.25 % IT SOLN
INTRATHECAL | Status: DC | PRN
  Administered 2021-01-24: 1.6 mL via INTRATHECAL

## 2021-01-24 MED ORDER — TRANEXAMIC ACID-NACL 1000-0.7 MG/100ML-% IV SOLN
1000.0000 mg | INTRAVENOUS | Status: AC
  Administered 2021-01-24: 1000 mg via INTRAVENOUS
  Filled 2021-01-24: qty 100

## 2021-01-24 MED ORDER — PROPOFOL 10 MG/ML IV BOLUS
INTRAVENOUS | Status: DC | PRN
  Administered 2021-01-24: 20 mg via INTRAVENOUS

## 2021-01-24 MED ORDER — MONTELUKAST SODIUM 10 MG PO TABS
10.0000 mg | ORAL_TABLET | Freq: Every day | ORAL | Status: DC
  Administered 2021-01-24 – 2021-01-26 (×3): 10 mg via ORAL
  Filled 2021-01-24 (×3): qty 1

## 2021-01-24 MED ORDER — LIDOCAINE HCL (PF) 2 % IJ SOLN
INTRAMUSCULAR | Status: AC
  Filled 2021-01-24: qty 10

## 2021-01-24 MED ORDER — ALBUTEROL SULFATE (2.5 MG/3ML) 0.083% IN NEBU: 2.5000 mg | INHALATION_SOLUTION | Freq: Four times a day (QID) | RESPIRATORY_TRACT | Status: DC | PRN

## 2021-01-24 SURGICAL SUPPLY — 54 items
ADH SKN CLS APL DERMABOND .7 (GAUZE/BANDAGES/DRESSINGS) ×1
ATTUNE MED ANAT PAT 38 KNEE (Knees) ×1 IMPLANT
ATTUNE PSFEM RTSZ6 NARCEM KNEE (Femur) ×1 IMPLANT
ATTUNE PSRP INSR SZ6 6 KNEE (Insert) ×1 IMPLANT
BAG COUNTER SPONGE SURGICOUNT (BAG) IMPLANT
BAG SPEC THK2 15X12 ZIP CLS (MISCELLANEOUS)
BAG SPNG CNTER NS LX DISP (BAG)
BAG ZIPLOCK 12X15 (MISCELLANEOUS) IMPLANT
BASE TIBIAL ROT PLAT SZ 5 KNEE (Knees) IMPLANT
BLADE SAW SGTL 11.0X1.19X90.0M (BLADE) IMPLANT
BLADE SAW SGTL 13.0X1.19X90.0M (BLADE) ×2 IMPLANT
BLADE SURG SZ10 CARB STEEL (BLADE) ×4 IMPLANT
BNDG ELASTIC 6X5.8 VLCR STR LF (GAUZE/BANDAGES/DRESSINGS) ×2 IMPLANT
BOWL SMART MIX CTS (DISPOSABLE) ×2 IMPLANT
BSPLAT TIB 5 CMNT ROT PLAT STR (Knees) ×1 IMPLANT
CEMENT HV SMART SET (Cement) ×2 IMPLANT
CUFF TOURN SGL QUICK 34 (TOURNIQUET CUFF) ×2
CUFF TRNQT CYL 34X4.125X (TOURNIQUET CUFF) ×1 IMPLANT
DECANTER SPIKE VIAL GLASS SM (MISCELLANEOUS) ×4 IMPLANT
DERMABOND ADVANCED (GAUZE/BANDAGES/DRESSINGS) ×1
DERMABOND ADVANCED .7 DNX12 (GAUZE/BANDAGES/DRESSINGS) ×1 IMPLANT
DRAPE INCISE IOBAN 66X45 STRL (DRAPES) ×2 IMPLANT
DRAPE U-SHAPE 47X51 STRL (DRAPES) ×2 IMPLANT
DRESSING AQUACEL AG SP 3.5X10 (GAUZE/BANDAGES/DRESSINGS) ×1 IMPLANT
DRSG AQUACEL AG ADV 3.5X10 (GAUZE/BANDAGES/DRESSINGS) ×1 IMPLANT
DRSG AQUACEL AG SP 3.5X10 (GAUZE/BANDAGES/DRESSINGS) ×2
DURAPREP 26ML APPLICATOR (WOUND CARE) ×4 IMPLANT
ELECT REM PT RETURN 15FT ADLT (MISCELLANEOUS) ×2 IMPLANT
GLOVE SURG ENC MOIS LTX SZ6 (GLOVE) ×2 IMPLANT
GLOVE SURG ENC MOIS LTX SZ7 (GLOVE) ×2 IMPLANT
GLOVE SURG UNDER POLY LF SZ7.5 (GLOVE) ×2 IMPLANT
GOWN STRL REUS W/TWL LRG LVL3 (GOWN DISPOSABLE) ×2 IMPLANT
HANDPIECE INTERPULSE COAX TIP (DISPOSABLE) ×2
HOLDER FOLEY CATH W/STRAP (MISCELLANEOUS) IMPLANT
KIT TURNOVER KIT A (KITS) IMPLANT
MANIFOLD NEPTUNE II (INSTRUMENTS) ×2 IMPLANT
NDL SAFETY ECLIPSE 18X1.5 (NEEDLE) IMPLANT
NEEDLE HYPO 18GX1.5 SHARP (NEEDLE)
NS IRRIG 1000ML POUR BTL (IV SOLUTION) ×2 IMPLANT
PACK TOTAL KNEE CUSTOM (KITS) ×2 IMPLANT
PROTECTOR NERVE ULNAR (MISCELLANEOUS) ×2 IMPLANT
SET HNDPC FAN SPRY TIP SCT (DISPOSABLE) ×1 IMPLANT
SET PAD KNEE POSITIONER (MISCELLANEOUS) ×2 IMPLANT
SUT MNCRL AB 4-0 PS2 18 (SUTURE) ×2 IMPLANT
SUT STRATAFIX PDS+ 0 24IN (SUTURE) ×2 IMPLANT
SUT VIC AB 1 CT1 36 (SUTURE) ×2 IMPLANT
SUT VIC AB 2-0 CT1 27 (SUTURE) ×6
SUT VIC AB 2-0 CT1 TAPERPNT 27 (SUTURE) ×3 IMPLANT
SYR 3ML LL SCALE MARK (SYRINGE) ×2 IMPLANT
TIBIAL BASE ROT PLAT SZ 5 KNEE (Knees) ×2 IMPLANT
TRAY FOLEY MTR SLVR 16FR STAT (SET/KITS/TRAYS/PACK) ×2 IMPLANT
TUBE SUCTION HIGH CAP CLEAR NV (SUCTIONS) ×2 IMPLANT
WATER STERILE IRR 1000ML POUR (IV SOLUTION) ×4 IMPLANT
WRAP KNEE MAXI GEL POST OP (GAUZE/BANDAGES/DRESSINGS) ×2 IMPLANT

## 2021-01-24 NOTE — Op Note (Signed)
NAME:  Mercedes Lucas                      MEDICAL RECORD NO.:  [id number]                             FACILITY:  [facility]      PHYSICIAN:  Madlyn Frankel. Charlann Boxer, M.D.  DATE OF BIRTH:  06-29-1955      DATE OF PROCEDURE:  01/24/2021                                     OPERATIVE REPORT         PREOPERATIVE DIAGNOSIS:  Right knee osteoarthritis.      POSTOPERATIVE DIAGNOSIS:  Right knee osteoarthritis.      FINDINGS:  The patient was noted to have complete loss of cartilage and   bone-on-bone arthritis with associated osteophytes in the medial and patellofemoral compartments of   the knee with a significant synovitis and associated effusion.  The patient had failed months of conservative treatment including medications, injection therapy, activity modification.     PROCEDURE:  Right total knee replacement.      COMPONENTS USED:  DePuy Attune rotating platform posterior stabilized knee   system, a size 6N femur, 5 tibia, size 6 mm PS AOX insert, and 38 anatomic patellar   button.      SURGEON:  Madlyn Frankel. Charlann Boxer, M.D.      ASSISTANT:  Rosalene Billings, PA-C.      ANESTHESIA:  Regional and Spinal.      SPECIMENS:  None.      COMPLICATION:  None.      DRAINS:  None.  EBL: <100      TOURNIQUET TIME:   Total Tourniquet Time Documented: Thigh (Right) - 33 minutes Total: Thigh (Right) - 33 minutes  .      The patient was stable to the recovery room.      INDICATION FOR PROCEDURE:  Mercedes Lucas is a 65 y.o. female patient of   mine.  The patient had been seen, evaluated, and treated for months conservatively in the   office with medication, activity modification, and injections.  The patient had   radiographic changes of bone-on-bone arthritis with endplate sclerosis and osteophytes noted.  Based on the radiographic changes and failed conservative measures, the patient   decided to proceed with definitive treatment, total knee replacement.  Risks of infection, DVT, component  failure, need for revision surgery, neurovascular injury were reviewed in the office setting.  The postop course was reviewed stressing the efforts to maximize post-operative satisfaction and function.  Consent was obtained for benefit of pain   relief.      PROCEDURE IN DETAIL:  The patient was brought to the operative theater.   Once adequate anesthesia, preoperative antibiotics, 2 gm of Ancef,1 gm of Tranexamic Acid, and 10 mg of Decadron administered, the patient was positioned supine with a right thigh tourniquet placed.  The  right lower extremity was prepped and draped in sterile fashion.  A time-   out was performed identifying the patient, planned procedure, and the appropriate extremity.      The right lower extremity was placed in the [facility] leg holder.  The leg was   exsanguinated, tourniquet elevated to 250 mmHg.  A midline incision was   made  followed by median parapatellar arthrotomy.  Following initial   exposure, attention was first directed to the patella.  Precut   measurement was noted to be 22 mm.  I resected down to 13-14 mm and used a   38 anatomic patellar button to restore patellar height as well as cover the cut surface.      The lug holes were drilled and a metal shim was placed to protect the   patella from retractors and saw blade during the procedure.      At this point, attention was now directed to the femur.  The femoral   canal was opened with a drill, irrigated to try to prevent fat emboli.  An   intramedullary rod was passed at 3 degrees valgus, 11 mm of bone was   resected off the distal femur due to pre-operative flexion contracture.  Following this resection, the tibia was   subluxated anteriorly.  Using the extramedullary guide, 2 mm of bone was resected off   the proximal medial tibia.  We confirmed the gap would be   stable medially and laterally with a size 5 spacer block as well as confirmed that the tibial cut was perpendicular in the coronal  plane, checking with an alignment rod.      Once this was done, I sized the femur to be a size 6 in the anterior-   posterior dimension, chose a narrow component based on medial and   lateral dimension.  The size 6 rotation block was then pinned in   position anterior referenced using the C-clamp to set rotation.  The   anterior, posterior, and  chamfer cuts were made without difficulty nor   notching making certain that I was along the anterior cortex to help   with flexion gap stability.      The final box cut was made off the lateral aspect of distal femur.      At this point, the tibia was sized to be a size 5.  The size 5 tray was   then pinned in position through the medial third of the tubercle,   drilled, and keel punched.  Trial reduction was now carried with a 6 femur,  5 tibia, a size 6 mm PS insert, and the 38 anatomic patella botton.  The knee was brought to full extension with good flexion stability with the patella   tracking through the trochlea without application of pressure.  Given   all these findings the trial components removed.  Final components were   opened and cement was mixed.  The knee was irrigated with normal saline solution and pulse lavage.  The synovial lining was   then injected with 30 cc of 0.25% Marcaine with epinephrine, 1 cc of Toradol and 30 cc of NS for a total of 61 cc.     Final implants were then cemented onto cleaned and dried cut surfaces of bone with the knee brought to extension with a size 6 mm PS trial insert.      Once the cement had fully cured, excess cement was removed   throughout the knee.  I confirmed that I was satisfied with the range of   motion and stability, and the final size 6 mm PS AOX insert was chosen.  It was   placed into the knee.      The tourniquet had been let down at 33 minutes.  No significant   hemostasis was required.  The extensor mechanism was then  reapproximated using #1 Vicryl and #1 Stratafix sutures with  the knee   in flexion.  The   remaining wound was closed with 2-0 Vicryl and running 4-0 Monocryl.   The knee was cleaned, dried, dressed sterilely using Dermabond and   Aquacel dressing.  The patient was then   brought to recovery room in stable condition, tolerating the procedure   well.   Please note that Physician Assistant, Rosalene Billings, PA-C was present for the entirety of the case, and was utilized for pre-operative positioning, peri-operative retractor management, general facilitation of the procedure and for primary wound closure at the end of the case.              Madlyn Frankel Charlann Boxer, M.D.    01/24/2021 11:26 AM

## 2021-01-24 NOTE — Care Plan (Signed)
Ortho Bundle Case Management Note  Patient Details  Name: Mercedes Lucas MRN: [id number] Date of Birth: 13-Jun-1955  R TKA on 01-24-21 DCP:  Home with husband.   DME:  No needs, has a RW and 3-in-1 PT:  [facility] on 01-30-21.                   DME Arranged:  N/A DME Agency:  NA  HH Arranged:  NA HH Agency:  NA  Additional Comments: Please contact me with any questions of if this plan should need to change.  Ennis Forts, RN,CCM [facility]  [phone] 01/24/2021, 2:45 PM

## 2021-01-24 NOTE — Anesthesia Procedure Notes (Addendum)
Procedure Name: MAC Date/Time: 01/24/2021 9:55 AM Performed by: Eben Burow, CRNA Pre-anesthesia Checklist: Patient identified, Emergency Drugs available, Suction available, Patient being monitored and Timeout performed Oxygen Delivery Method: Simple face mask Placement Confirmation: positive ETCO2

## 2021-01-24 NOTE — Anesthesia Procedure Notes (Addendum)
Spinal  Patient location during procedure: OR Start time: 01/24/2021 10:02 AM Reason for block: surgical anesthesia Staffing Performed: resident/CRNA  Anesthesiologist: Darral Dash, DO Resident/CRNA: Eben Burow, CRNA Preanesthetic Checklist Completed: patient identified, IV checked, site marked, risks and benefits discussed, surgical consent, monitors and equipment checked, pre-op evaluation and timeout performed Spinal Block Patient position: sitting Prep: DuraPrep and site prepped and draped Patient monitoring: heart rate, cardiac monitor, continuous pulse ox and blood pressure Approach: midline Location: L3-4 Injection technique: single-shot Needle Needle type: Pencan  Needle gauge: 24 G Needle length: 10 cm Assessment Events: CSF return Additional Notes Pt placed in sitting position, spinal kit expiration date checked and verified, + CSF, - heme, pt tolerated well. Adequate sensory level. Dr Gloris Manchester supervising for SAB placement.

## 2021-01-24 NOTE — Discharge Instructions (Signed)

## 2021-01-24 NOTE — Interval H&P Note (Signed)
History and Physical Interval Note:  01/24/2021 8:44 AM  Mercedes Lucas  has presented today for surgery, with the diagnosis of Right knee osteoarthritis.  The various methods of treatment have been discussed with the patient and family. After consideration of risks, benefits and other options for treatment, the patient has consented to  Procedure(s): TOTAL KNEE ARTHROPLASTY (Right) as a surgical intervention.  The patient's history has been reviewed, patient examined, no change in status, stable for surgery.  I have reviewed the patient's chart and labs.  Questions were answered to the patient's satisfaction.     Shelda Pal

## 2021-01-24 NOTE — Plan of Care (Signed)
  Problem: Education: Goal: Knowledge of General Education information will improve Description: Including pain rating scale, medication(s)/side effects and non-pharmacologic comfort measures Outcome: Progressing   Problem: Activity: Goal: Risk for activity intolerance will decrease Outcome: Progressing   Problem: Nutrition: Goal: Adequate nutrition will be maintained Outcome: Progressing   Problem: Elimination: Goal: Will not experience complications related to bowel motility Outcome: Progressing   Problem: Pain Managment: Goal: General experience of comfort will improve Outcome: Progressing   Problem: Education: Goal: Knowledge of the prescribed therapeutic regimen will improve Outcome: Progressing   Problem: Activity: Goal: Ability to avoid complications of mobility impairment will improve Outcome: Progressing   

## 2021-01-24 NOTE — Progress Notes (Signed)
Assisted Dr. Gregory Stoltzfus with Right Knee Adductor Canal block. Side rails up, monitors on throughout procedure. See vital signs in flow sheet. Tolerated Procedure well. ? ?

## 2021-01-24 NOTE — Anesthesia Preprocedure Evaluation (Signed)
Anesthesia Evaluation  Patient identified by MRN, date of birth, ID band Patient awake    Reviewed: Allergy & Precautions, NPO status , Patient's Chart, lab work & pertinent test results  History of Anesthesia Complications (+) PONV  Airway Mallampati: II  TM Distance: >3 FB Neck ROM: Full    Dental no notable dental hx.    Pulmonary asthma ,  COPD inhaler, Current Smoker and Patient abstained from smoking.,    Pulmonary exam normal        Cardiovascular hypertension, Pt. on medications  Rhythm:Regular Rate:Normal     Neuro/Psych  Headaches, negative psych ROS   GI/Hepatic negative GI ROS, Neg liver ROS,   Endo/Other  diabetes, Type 2, Oral Hypoglycemic Agents  Renal/GU negative Renal ROS  negative genitourinary   Musculoskeletal  (+) Arthritis , Osteoarthritis,    Abdominal Normal abdominal exam  (+)   Peds  Hematology  (+) anemia ,   Anesthesia Other Findings   Reproductive/Obstetrics                             Anesthesia Physical Anesthesia Plan  ASA: 2  Anesthesia Plan: MAC, Spinal and Regional   Post-op Pain Management: Regional block   Induction: Intravenous  PONV Risk Score and Plan: 2 and Ondansetron, Dexamethasone, Propofol infusion, Midazolam and Treatment may vary due to age or medical condition  Airway Management Planned: Simple Face Mask and Nasal Cannula  Additional Equipment: None  Intra-op Plan:   Post-operative Plan:   Informed Consent: I have reviewed the patients History and Physical, chart, labs and discussed the procedure including the risks, benefits and alternatives for the proposed anesthesia with the patient or authorized representative who has indicated his/her understanding and acceptance.     Dental advisory given  Plan Discussed with:   Anesthesia Plan Comments:         Anesthesia Quick Evaluation

## 2021-01-24 NOTE — Anesthesia Procedure Notes (Signed)
Anesthesia Regional Block: Adductor canal block   Pre-Anesthetic Checklist: , timeout performed,  Correct Patient, Correct Site, Correct Laterality,  Correct Procedure, Correct Position, site marked,  Risks and benefits discussed,  Surgical consent,  Pre-op evaluation,  At surgeon's request and post-op pain management  Laterality: Right  Prep: Dura Prep       Needles:  Injection technique: Single-shot  Needle Type: Echogenic Stimulator Needle     Needle Length: 10cm  Needle Gauge: 20     Additional Needles:   Procedures:,,,, ultrasound used (permanent image in chart),,    Narrative:  Start time: 01/24/2021 8:53 AM End time: 01/24/2021 8:55 AM Injection made incrementally with aspirations every 5 mL.  Performed by: Personally  Anesthesiologist: Atilano Median, DO  Additional Notes: Patient identified. Risks/Benefits/Options discussed with patient including but not limited to bleeding, infection, nerve damage, failed block, incomplete pain control. Patient expressed understanding and wished to proceed. All questions were answered. Sterile technique was used throughout the entire procedure. Please see nursing notes for vital signs. Aspirated in 5cc intervals with injection for negative confirmation. Patient was given instructions on fall risk and not to get out of bed. All questions and concerns addressed with instructions to call with any issues or inadequate analgesia.

## 2021-01-24 NOTE — Transfer of Care (Signed)
Immediate Anesthesia Transfer of Care Note  Patient: Liana Crocker  Procedure(s) Performed: TOTAL KNEE ARTHROPLASTY (Right: Knee)  Patient Location: PACU  Anesthesia Type:Spinal  Level of Consciousness: awake, alert  and patient cooperative  Airway & Oxygen Therapy: Patient Spontanous Breathing and Patient connected to face mask oxygen  Post-op Assessment: Report given to RN and Post -op Vital signs reviewed and stable  Post vital signs: Reviewed and stable  Last Vitals:  Vitals Value Taken Time  BP 103/66 01/24/21 1151  Temp    Pulse 66 01/24/21 1153  Resp 14 01/24/21 1153  SpO2 100 % 01/24/21 1153  Vitals shown include unvalidated device data.  Last Pain:  Vitals:   01/24/21 0753  TempSrc: Oral  PainSc:       Patients Stated Pain Goal: 4 (01/24/21 0746)  Complications: No notable events documented.

## 2021-01-25 LAB — CBC
HCT: 33.5 % — ABNORMAL LOW (ref 36.0–46.0)
Hemoglobin: 10.7 g/dL — ABNORMAL LOW (ref 12.0–15.0)
MCH: 27.7 pg (ref 26.0–34.0)
MCHC: 31.9 g/dL (ref 30.0–36.0)
MCV: 86.8 fL (ref 80.0–100.0)
Platelets: 213 10*3/uL (ref 150–400)
RBC: 3.86 MIL/uL — ABNORMAL LOW (ref 3.87–5.11)
RDW: 13.6 % (ref 11.5–15.5)
WBC: 20 10*3/uL — ABNORMAL HIGH (ref 4.0–10.5)
nRBC: 0 % (ref 0.0–0.2)

## 2021-01-25 LAB — BASIC METABOLIC PANEL
Anion gap: 8 (ref 5–15)
BUN: 15 mg/dL (ref 8–23)
CO2: 25 mmol/L (ref 22–32)
Calcium: 9.7 mg/dL (ref 8.9–10.3)
Chloride: 103 mmol/L (ref 98–111)
Creatinine, Ser: 0.67 mg/dL (ref 0.44–1.00)
GFR, Estimated: >60 mL/min (ref >60)
Glucose, Bld: 191 mg/dL — ABNORMAL HIGH (ref 70–99)
Potassium: 3.7 mmol/L (ref 3.5–5.1)
Sodium: 136 mmol/L (ref 135–145)

## 2021-01-25 MED ORDER — METHOCARBAMOL 500 MG PO TABS
500.0000 mg | ORAL_TABLET | Freq: Four times a day (QID) | ORAL | 0 refills | Status: DC | PRN
Start: 2021-01-25 — End: 2023-12-26

## 2021-01-25 MED ORDER — CELECOXIB 200 MG PO CAPS: 200.0000 mg | ORAL_CAPSULE | Freq: Two times a day (BID) | ORAL | 0 refills | Status: DC

## 2021-01-25 MED ORDER — ASPIRIN 81 MG PO CHEW: 81.0000 mg | CHEWABLE_TABLET | Freq: Two times a day (BID) | ORAL | 0 refills | Status: AC

## 2021-01-25 MED ORDER — DOCUSATE SODIUM 100 MG PO CAPS
100.0000 mg | ORAL_CAPSULE | Freq: Two times a day (BID) | ORAL | 0 refills | Status: DC
Start: 2021-01-25 — End: 2023-12-26

## 2021-01-25 MED ORDER — HYDROXYZINE HCL 25 MG PO TABS
25.0000 mg | ORAL_TABLET | Freq: Three times a day (TID) | ORAL | Status: DC | PRN
  Administered 2021-01-25: 10:00:00 25 mg via ORAL
  Filled 2021-01-25: qty 1

## 2021-01-25 MED ORDER — POLYETHYLENE GLYCOL 3350 17 G PO PACK
17.0000 g | PACK | Freq: Every day | ORAL | 0 refills | Status: DC | PRN
Start: 2021-01-25 — End: 2023-12-26

## 2021-01-25 MED ORDER — HYDROXYZINE HCL 25 MG PO TABS: 25.0000 mg | ORAL_TABLET | Freq: Three times a day (TID) | ORAL | 0 refills | Status: DC | PRN

## 2021-01-25 MED ORDER — POTASSIUM CHLORIDE CRYS ER 20 MEQ PO TBCR
20.0000 meq | EXTENDED_RELEASE_TABLET | Freq: Once | ORAL | Status: AC
Start: 2021-01-25 — End: 2021-01-25
  Administered 2021-01-25: 10:00:00 20 meq via ORAL
  Filled 2021-01-25: qty 1

## 2021-01-25 NOTE — TOC Transition Note (Signed)
Transition of Care [facility]) - CM/SW Discharge Note  Patient Details  Name: Mercedes Lucas MRN: [id number] Date of Birth: 07-Aug-1955  [facility] CM/SW Contact:  Sherie Don, LCSW Phone Number: 01/25/2021, 9:42 AM  Clinical Narrative: Patient is expected to discharge home after working with PT. CSW met with patient and her husband to confirm discharge plan. Patient will discharge home with OPPT at [facility] with her first appointment scheduled for next week. Patient has a rolling walker, 3N1, elevated toilets, and shower chair at home so there are no DME needs at this time. TOC signing off.  Final next level of care: OP Rehab Barriers to Discharge: No Barriers Identified  Patient Goals and CMS Choice Patient states their goals for this hospitalization and ongoing recovery are:: Discharge home with OPPT at [facility] Choice offered to / list presented to : NA  Discharge Plan and Services        DME Arranged: N/A DME Agency: NA HH Arranged: NA HH Agency: NA  Readmission Risk Interventions No flowsheet data found.

## 2021-01-25 NOTE — Anesthesia Postprocedure Evaluation (Signed)
Anesthesia Post Note  Patient: Mercedes Lucas  Procedure(s) Performed: TOTAL KNEE ARTHROPLASTY (Right: Knee)     Patient location during evaluation: PACU Anesthesia Type: Regional, MAC and Spinal Level of consciousness: awake and alert Pain management: pain level controlled Vital Signs Assessment: post-procedure vital signs reviewed and stable Respiratory status: spontaneous breathing, nonlabored ventilation, respiratory function stable and patient connected to nasal cannula oxygen Cardiovascular status: stable and blood pressure returned to baseline Postop Assessment: no apparent nausea or vomiting Anesthetic complications: no   No notable events documented.  Last Vitals:  Vitals:   01/25/21 0129 01/25/21 0552  BP: 131/71 128/75  Pulse: 62 62  Resp: 16 16  Temp: 36.4 C 36.6 C  SpO2: 98% 99%    Last Pain:  Vitals:   01/25/21 0552  TempSrc: Oral  PainSc:                  March Rummage Gaynell Eggleton

## 2021-01-25 NOTE — Evaluation (Signed)
Physical Therapy Evaluation Patient Details Name: Mercedes Lucas MRN: [id number] DOB: 1955/09/03 Today's Date: 01/25/2021  History of Present Illness  Pt is 65 yo female admitted 01/24/21 for R TKA.  Pt with hx including HTN, L TKA 2016, arthritis, and allergies.  Clinical Impression  Pt is s/p TKA resulting in the deficits listed below (see PT Problem List). At baseline, pt is independent but was having some limitations due to pain in R knee.  Today, pt limited by complaints of pain and severe itching in R leg that she reports is NOT baseline.  She did ambulate 64' but at slow pace and limited weight on R LE.  Pt does not feel ready to d/c due to pain/itching.  Will f/u in afternoon for further therapy. Pt will benefit from skilled PT to increase their independence and safety with mobility to allow discharge to the venue listed below.           Recommendations for follow up therapy are one component of a multi-disciplinary discharge planning process, led by the attending physician.  Recommendations may be updated based on patient status, additional functional criteria and insurance authorization.  Follow Up Recommendations Follow physician's recommendations for discharge plan and follow up therapies    Assistance Recommended at Discharge Frequent or constant Supervision/Assistance  Functional Status Assessment Patient has had a recent decline in their functional status and demonstrates the ability to make significant improvements in function in a reasonable and predictable amount of time.  Equipment Recommendations  None recommended by PT    Recommendations for Other Services       Precautions / Restrictions Precautions Precautions: None Restrictions Weight Bearing Restrictions: Yes RLE Weight Bearing: Weight bearing as tolerated      Mobility  Bed Mobility Overal bed mobility: Needs Assistance Bed Mobility: Supine to Sit     Supine to sit: Supervision;HOB elevated      General bed mobility comments: Pt self assisted R LE with L LE    Transfers Overall transfer level: Needs assistance Equipment used: Rolling walker (2 wheels) Transfers: Sit to/from Stand Sit to Stand: Min guard;Min assist           General transfer comment: Performed from bed (min g) and low toilet (min A); cues for hand placement    Ambulation/Gait Ambulation/Gait assistance: Min guard Gait Distance (Feet): 60 Feet Assistive device: Rolling walker (2 wheels) Gait Pattern/deviations: Step-to pattern;Decreased stride length;Decreased weight shift to right;Antalgic Gait velocity: decreased     General Gait Details: Frequent cues for sequencing (step to R for pain control) and RW proximity (tends to get too close); Pt also having difficulty getting R foot flat due to pain  Stairs            Wheelchair Mobility    Modified Rankin (Stroke Patients Only)       Balance Overall balance assessment: Needs assistance Sitting-balance support: No upper extremity supported Sitting balance-Leahy Scale: Good     Standing balance support: Bilateral upper extremity supported;Reliant on assistive device for balance Standing balance-Leahy Scale: Poor Standing balance comment: Steady with RW or sink during ADLs                             Pertinent Vitals/Pain Pain Assessment: 0-10 Pain Score: 10-Worst pain ever Pain Location: R knee Pain Descriptors / Indicators: Discomfort;Throbbing (itching) Pain Intervention(s): Limited activity within patient's tolerance;Monitored during session    Home Living Family/patient expects to be  discharged to:: Private residence Living Arrangements: Spouse/significant other Available Help at Discharge: Family;Available 24 hours/day Type of Home: House Home Access: Stairs to enter Entrance Stairs-Rails: None Entrance Stairs-Number of Steps: 1   Home Layout: One level Home Equipment: Teacher, English as a foreign language (2 wheels);Shower  seat;Cane - single point      Prior Function Prior Level of Function : Independent/Modified Independent             Mobility Comments: Some difficulties over last 3 months due to knee pain - Still ambulating without AD but painful       Hand Dominance        Extremity/Trunk Assessment   Upper Extremity Assessment Upper Extremity Assessment: Overall WFL for tasks assessed    Lower Extremity Assessment Lower Extremity Assessment: LLE deficits/detail;RLE deficits/detail RLE Deficits / Details: Expected post op changes; ROM : 5 to 80 degrees knee, WFL and hip and ankle; MMT: ankle 5/5, knee 2/5, hip 2/5 LLE Deficits / Details: ROM WFL; MMT 5/5    Cervical / Trunk Assessment Cervical / Trunk Assessment: Normal  Communication   Communication: No difficulties  Cognition Arousal/Alertness: Awake/alert Behavior During Therapy: WFL for tasks assessed/performed Overall Cognitive Status: Within Functional Limits for tasks assessed                                          General Comments General comments (skin integrity, edema, etc.): Pt with c/o severe pain and itching.  Itching only in R leg and reports not her baseline    Exercises Total Joint Exercises Ankle Circles/Pumps: AROM;Both;10 reps;Supine Quad Sets: AROM;Both;10 reps;Supine Long Arc Quad: AAROM;Right;5 reps;Seated Goniometric ROM: R knee 5 to 80   Assessment/Plan    PT Assessment Patient needs continued PT services  PT Problem List Decreased strength;Decreased mobility;Decreased safety awareness;Decreased range of motion;Decreased activity tolerance;Decreased balance;Decreased knowledge of use of DME;Pain       PT Treatment Interventions DME instruction;Therapeutic activities;Modalities;Gait training;Therapeutic exercise;Patient/family education;Stair training;Functional mobility training;Balance training    PT Goals (Current goals can be found in the Care Plan section)  Acute Rehab PT  Goals Patient Stated Goal: decrease itching PT Goal Formulation: With patient Time For Goal Achievement: 02/08/21 Potential to Achieve Goals: Good Additional Goals Additional Goal #1: Pain will decrease to 5/10 to allow for improved ambulation    Frequency 7X/week   Barriers to discharge        Co-evaluation               AM-PAC PT "6 Clicks" Mobility  Outcome Measure Help needed turning from your back to your side while in a flat bed without using bedrails?: A Little Help needed moving from lying on your back to sitting on the side of a flat bed without using bedrails?: A Little Help needed moving to and from a bed to a chair (including a wheelchair)?: A Little Help needed standing up from a chair using your arms (e.g., wheelchair or bedside chair)?: A Little Help needed to walk in hospital room?: A Little Help needed climbing 3-5 steps with a railing? : A Lot 6 Click Score: 17    End of Session Equipment Utilized During Treatment: Gait belt Activity Tolerance: Patient tolerated treatment well Patient left: with chair alarm set;in chair;with call bell/phone within reach Nurse Communication: Mobility status;Other (comment) (pain and itching limiting) PT Visit Diagnosis: Other abnormalities of gait and mobility (R26.89);Muscle weakness (generalized) (  M62.81);Pain Pain - Right/Left: Right Pain - part of body: Knee    Time: 1120-1200 PT Time Calculation (min) (ACUTE ONLY): 40 min   Charges:   PT Evaluation $PT Eval Low Complexity: 1 Low PT Treatments $Gait Training: 8-22 mins $Therapeutic Activity: 8-22 mins        Anise Salvo, PT Acute Rehab Services Pager [phone] [facility] [phone]   Rayetta Humphrey 01/25/2021, 12:33 PM

## 2021-01-25 NOTE — Progress Notes (Signed)
Physical Therapy Treatment Patient Details Name: Mercedes Lucas MRN: [id number] DOB: 10/22/1955 Today's Date: 01/25/2021   History of Present Illness Pt is 65 yo female admitted 01/24/21 for R TKA.  Pt with hx including HTN, L TKA 2016, arthritis, and allergies.    PT Comments    Pt reports slight improvement with pain with walking, but still significant and with itching.  She ambulated 48' with min guard but was lethargic and requiring cues for RW.  Pt needing cues for safety and does not feel pain control adequate to return home.  Will f/u tomorrow for further therapy.     Recommendations for follow up therapy are one component of a multi-disciplinary discharge planning process, led by the attending physician.  Recommendations may be updated based on patient status, additional functional criteria and insurance authorization.  Follow Up Recommendations  Follow physician's recommendations for discharge plan and follow up therapies     Assistance Recommended at Discharge Frequent or constant Supervision/Assistance  Equipment Recommendations  None recommended by PT    Recommendations for Other Services       Precautions / Restrictions Precautions Precautions: None Restrictions Weight Bearing Restrictions: Yes RLE Weight Bearing: Weight bearing as tolerated     Mobility  Bed Mobility Overal bed mobility: Needs Assistance Bed Mobility: Sit to Supine     Supine to sit: Supervision;HOB elevated Sit to supine: Supervision;HOB elevated   General bed mobility comments: Pt self assisted R LE with L LE    Transfers Overall transfer level: Needs assistance Equipment used: Rolling walker (2 wheels) Transfers: Sit to/from Stand Sit to Stand: Min guard           General transfer comment: Min guard from toilet with use of handrail    Ambulation/Gait Ambulation/Gait assistance: Min guard Gait Distance (Feet): 60 Feet Assistive device: Rolling walker (2 wheels) Gait  Pattern/deviations: Step-to pattern;Decreased stride length;Decreased weight shift to right;Antalgic Gait velocity: decreased     General Gait Details: Frequent cues for sequencing (step to R for pain control) and RW proximity (tends to get too close); Pt was able to get R foot flat this afternoon   Stairs Stairs:  (too painful so held)           Wheelchair Mobility    Modified Rankin (Stroke Patients Only)       Balance Overall balance assessment: Needs assistance Sitting-balance support: No upper extremity supported Sitting balance-Leahy Scale: Good     Standing balance support: Bilateral upper extremity supported;Reliant on assistive device for balance Standing balance-Leahy Scale: Poor Standing balance comment: Steady with RW or sink during ADLs                            Cognition Arousal/Alertness: Lethargic;Suspect due to medications Behavior During Therapy: [facility] for tasks assessed/performed Overall Cognitive Status: Within Functional Limits for tasks assessed                                          Exercises Total Joint Exercises Ankle Circles/Pumps: AROM;Both;10 reps;Supine Quad Sets: AROM;Both;10 reps;Supine Long Arc Quad: AAROM;Right;5 reps;Seated Goniometric ROM: R knee 5 to 80 Other Exercises Other Exercises: Further exercise held due to pain    General Comments General comments (skin integrity, edema, etc.): Pt with c/o severe pain and itching.  Itching only in R leg and reports not her  baseline      Pertinent Vitals/Pain Pain Assessment: 0-10 Pain Score: 10-Worst pain ever Pain Location: R knee Pain Descriptors / Indicators: Discomfort;Throbbing (itching) Pain Intervention(s): Limited activity within patient's tolerance;Monitored during session;Premedicated before session    Home Living                          Prior Function            PT Goals (current goals can now be found in the care plan  section) Acute Rehab PT Goals Patient Stated Goal: decrease itching PT Goal Formulation: With patient Time For Goal Achievement: 02/08/21 Potential to Achieve Goals: Good Additional Goals Additional Goal #1: Pain will decrease to 5/10 to allow for improved ambulation Progress towards PT goals: Progressing toward goals    Frequency    7X/week      PT Plan Current plan remains appropriate    Co-evaluation              AM-PAC PT "6 Clicks" Mobility   Outcome Measure  Help needed turning from your back to your side while in a flat bed without using bedrails?: A Little Help needed moving from lying on your back to sitting on the side of a flat bed without using bedrails?: A Little Help needed moving to and from a bed to a chair (including a wheelchair)?: A Little Help needed standing up from a chair using your arms (e.g., wheelchair or bedside chair)?: A Little Help needed to walk in hospital room?: A Little Help needed climbing 3-5 steps with a railing? : A Lot 6 Click Score: 17    End of Session Equipment Utilized During Treatment: Gait belt Activity Tolerance: Patient tolerated treatment well Patient left: in bed;with call bell/phone within reach;with bed alarm set;with SCD's reapplied Nurse Communication: Mobility status (need for further therapy; poor pain control but pt lethargic) PT Visit Diagnosis: Other abnormalities of gait and mobility (R26.89);Muscle weakness (generalized) (M62.81);Pain Pain - Right/Left: Right Pain - part of body: Knee     Time: 1522-1550 PT Time Calculation (min) (ACUTE ONLY): 28 min  Charges:  $Gait Training: 8-22 mins $Therapeutic Activity: 8-22 mins                     Anise Salvo, PT Acute Rehab Services Pager [phone] [facility] [phone]    Rayetta Humphrey 01/25/2021, 3:57 PM

## 2021-01-25 NOTE — Progress Notes (Signed)
° °  Subjective: 1 Day Post-Op Procedure(s) (LRB): TOTAL KNEE ARTHROPLASTY (Right) Patient reports pain as mild.   Patient seen in rounds with Dr. Charlann Boxer. Patient is well, and has had no acute complaints or problems. No acute events overnight. Foley catheter removed. Patient did not get up with PT yet.  We will start therapy today.   Objective: Vital signs in last 24 hours: Temp:  [97.4 F (36.3 C)-98.2 F (36.8 C)] 97.8 F (36.6 C) (12/14 0552) Pulse Rate:  [58-71] 62 (12/14 0552) Resp:  [12-21] 16 (12/14 0552) BP: (99-185)/(60-101) 128/75 (12/14 0552) SpO2:  [93 %-100 %] 99 % (12/14 0552)  Intake/Output from previous day:  Intake/Output Summary (Last 24 hours) at 01/25/2021 0822 Last data filed at 01/25/2021 0645 Gross per 24 hour  Intake 3319.44 ml  Output 2225 ml  Net 1094.44 ml     Intake/Output this shift: No intake/output data recorded.  Labs: Recent Labs    01/25/21 0334  HGB 10.7*   Recent Labs    01/25/21 0334  WBC 20.0*  RBC 3.86*  HCT 33.5*  PLT 213   Recent Labs    01/25/21 0334  NA 136  K 3.7  CL 103  CO2 25  BUN 15  CREATININE 0.67  GLUCOSE 191*  CALCIUM 9.7   No results for input(s): LABPT, INR in the last 72 hours.  Exam: General - Patient is Alert and Oriented Extremity - Neurologically intact Sensation intact distally Intact pulses distally Dorsiflexion/Plantar flexion intact Dressing - dressing C/D/I Motor Function - intact, moving foot and toes well on exam.   Past Medical History:  Diagnosis Date   Allergy    Anemia    Arthritis    Asthma    "mild-no attacks"   Headache    migraines occasional   Hypercholesteremia    Hypertension    Pneumonia    PONV (postoperative nausea and vomiting)    Pre-diabetes     Assessment/Plan: 1 Day Post-Op Procedure(s) (LRB): TOTAL KNEE ARTHROPLASTY (Right) Principal Problem:   S/P total knee arthroplasty, right  Estimated body mass index is 28.76 kg/m as calculated from the  following:   Height as of this encounter: 5\' 5"  (1.651 m).   Weight as of this encounter: 78.4 kg. Advance diet Up with therapy D/C IV fluids   Patient's anticipated LOS is less than 2 midnights, meeting these requirements: - Younger than 44 - Lives within 1 hour of care - Has a competent adult at home to recover with post-op recover - NO history of  - Chronic pain requiring opiods  - Diabetes  - Coronary Artery Disease  - Heart failure  - Heart attack  - Stroke  - DVT/VTE  - Cardiac arrhythmia  - Respiratory Failure/COPD  - Renal failure  - Anemia  - Advanced Liver disease     DVT Prophylaxis - Aspirin Weight bearing as tolerated.  Patient reports significant itching, which was present prior to surgery and she did see a dermatologist for this. Dr. 76 discussed trying hydroxyzine for her which we will send.   Plan is to go Home after hospital stay. Plan for discharge today following 1-2 sessions of PT as long as they are meeting their goals. Patient is scheduled for OPPT. Follow up in the office in 2 weeks.   Charlann Boxer, PA-C Orthopedic Surgery [phone] 01/25/2021, 8:22 AM

## 2021-01-26 ENCOUNTER — Encounter (HOSPITAL_COMMUNITY): Payer: Self-pay | Admitting: Orthopedic Surgery

## 2021-01-26 DIAGNOSIS — Z96652 Presence of left artificial knee joint: Secondary | ICD-10-CM | POA: Diagnosis present

## 2021-01-26 DIAGNOSIS — L299 Pruritus, unspecified: Secondary | ICD-10-CM | POA: Diagnosis not present

## 2021-01-26 DIAGNOSIS — Z91030 Bee allergy status: Secondary | ICD-10-CM | POA: Diagnosis not present

## 2021-01-26 DIAGNOSIS — Z79899 Other long term (current) drug therapy: Secondary | ICD-10-CM | POA: Diagnosis not present

## 2021-01-26 DIAGNOSIS — F17210 Nicotine dependence, cigarettes, uncomplicated: Secondary | ICD-10-CM | POA: Diagnosis present

## 2021-01-26 DIAGNOSIS — Z7984 Long term (current) use of oral hypoglycemic drugs: Secondary | ICD-10-CM | POA: Diagnosis not present

## 2021-01-26 DIAGNOSIS — E7800 Pure hypercholesterolemia, unspecified: Secondary | ICD-10-CM | POA: Diagnosis present

## 2021-01-26 DIAGNOSIS — Z7951 Long term (current) use of inhaled steroids: Secondary | ICD-10-CM | POA: Diagnosis not present

## 2021-01-26 DIAGNOSIS — Z888 Allergy status to other drugs, medicaments and biological substances status: Secondary | ICD-10-CM | POA: Diagnosis not present

## 2021-01-26 DIAGNOSIS — M1711 Unilateral primary osteoarthritis, right knee: Secondary | ICD-10-CM | POA: Diagnosis present

## 2021-01-26 DIAGNOSIS — Z791 Long term (current) use of non-steroidal anti-inflammatories (NSAID): Secondary | ICD-10-CM | POA: Diagnosis not present

## 2021-01-26 DIAGNOSIS — Z881 Allergy status to other antibiotic agents status: Secondary | ICD-10-CM | POA: Diagnosis not present

## 2021-01-26 DIAGNOSIS — J4520 Mild intermittent asthma, uncomplicated: Secondary | ICD-10-CM | POA: Diagnosis present

## 2021-01-26 DIAGNOSIS — G8918 Other acute postprocedural pain: Secondary | ICD-10-CM | POA: Diagnosis not present

## 2021-01-26 DIAGNOSIS — I10 Essential (primary) hypertension: Secondary | ICD-10-CM | POA: Diagnosis present

## 2021-01-26 DIAGNOSIS — Z79890 Hormone replacement therapy (postmenopausal): Secondary | ICD-10-CM | POA: Diagnosis not present

## 2021-01-26 DIAGNOSIS — Z885 Allergy status to narcotic agent status: Secondary | ICD-10-CM | POA: Diagnosis not present

## 2021-01-26 LAB — CBC
HCT: 34.3 % — ABNORMAL LOW (ref 36.0–46.0)
Hemoglobin: 10.8 g/dL — ABNORMAL LOW (ref 12.0–15.0)
MCH: 27 pg (ref 26.0–34.0)
MCHC: 31.5 g/dL (ref 30.0–36.0)
MCV: 85.8 fL (ref 80.0–100.0)
Platelets: 212 10*3/uL (ref 150–400)
RBC: 4 MIL/uL (ref 3.87–5.11)
RDW: 14 % (ref 11.5–15.5)
WBC: 15.6 10*3/uL — ABNORMAL HIGH (ref 4.0–10.5)
nRBC: 0 % (ref 0.0–0.2)

## 2021-01-26 MED ORDER — KETOROLAC TROMETHAMINE 15 MG/ML IJ SOLN
7.5000 mg | Freq: Four times a day (QID) | INTRAMUSCULAR | Status: AC
  Administered 2021-01-26 (×2): 7.5 mg via INTRAVENOUS
  Filled 2021-01-26 (×2): qty 1

## 2021-01-26 MED ORDER — CYCLOBENZAPRINE HCL 5 MG PO TABS
7.5000 mg | ORAL_TABLET | Freq: Three times a day (TID) | ORAL | Status: DC | PRN
  Administered 2021-01-26 (×2): 7.5 mg via ORAL
  Filled 2021-01-26 (×2): qty 2

## 2021-01-26 NOTE — Progress Notes (Signed)
Physical Therapy Treatment Patient Details Name: Mercedes Lucas MRN: [id number] DOB: 08-Oct-1955 Today's Date: 01/26/2021   History of Present Illness Pt is 65 yo female admitted 01/24/21 for R TKA.  Pt with hx including HTN, L TKA 2016, arthritis, and allergies.    PT Comments    Pt much more alert and progressed well with therapy.  She was able to ambulate 150' and perform platform step similar to home set up.  Pain was fair (reporting 7/10 vs 10/10 in past).  Pt did require min cues for safety.  From therapy perspective, pt demonstrates safe gait & transfers in order to return home from PT perspective once discharged by MD.  While in hospital, will continue to benefit from PT for skilled therapy to advance mobility and exercises.  Pt expressing that she would like to stay another night for pain control and rest -notified RN.     Recommendations for follow up therapy are one component of a multi-disciplinary discharge planning process, led by the attending physician.  Recommendations may be updated based on patient status, additional functional criteria and insurance authorization.  Follow Up Recommendations  Follow physician's recommendations for discharge plan and follow up therapies     Assistance Recommended at Discharge Frequent or constant Supervision/Assistance  Equipment Recommendations  None recommended by PT    Recommendations for Other Services       Precautions / Restrictions Precautions Precautions: Fall Restrictions RLE Weight Bearing: Weight bearing as tolerated     Mobility  Bed Mobility Overal bed mobility: Needs Assistance Bed Mobility: Supine to Sit;Sit to Supine     Supine to sit: Supervision Sit to supine: Supervision   General bed mobility comments: Demonstrated with gait belt for AAROM R LE    Transfers Overall transfer level: Needs assistance Equipment used: Rolling walker (2 wheels) Transfers: Sit to/from Stand Sit to Stand: Supervision            General transfer comment: Performed x 3    Ambulation/Gait Ambulation/Gait assistance: Min guard Gait Distance (Feet): 150 Feet Assistive device: Rolling walker (2 wheels) Gait Pattern/deviations: Step-to pattern;Decreased stride length;Decreased weight shift to right;Antalgic Gait velocity: decreased     General Gait Details: Pt initially with step to R gait progressing to partial step through.  Improved alertness.  Min cues for RW proximity but did better once progressing to partial step through pattern   Stairs Stairs: Yes Stairs assistance: Min guard Stair Management: Step to pattern;Backwards;Forwards;With walker   General stair comments: Performed 1 platform step x 4.  Tried forward and backward up - did well with both.  Did need cues for "up with good and down with bad"   Wheelchair Mobility    Modified Rankin (Stroke Patients Only)       Balance Overall balance assessment: Needs assistance Sitting-balance support: No upper extremity supported Sitting balance-Leahy Scale: Good   Postural control: Posterior lean Standing balance support: Bilateral upper extremity supported;Reliant on assistive device for balance Standing balance-Leahy Scale: Poor Standing balance comment: Requiring RW but was steady with RW                            Cognition Arousal/Alertness: Awake/alert Behavior During Therapy: WFL for tasks assessed/performed Overall Cognitive Status: Within Functional Limits for tasks assessed Area of Impairment: Memory;Safety/judgement;Awareness                     Memory: Decreased short-term memory  Safety/Judgement: Decreased awareness of safety Awareness: Emergent   General Comments: Much more alert this afternoon - had not received Flexaril        Exercises Total Joint Exercises Ankle Circles/Pumps: AROM;Both;10 reps;Seated Quad Sets: AROM;Both;10 reps;Supine Towel Squeeze: AROM;Both;10 reps;Supine Heel  Slides: AAROM;Right;10 reps;Supine Hip ABduction/ADduction: AAROM;Right;10 reps;Supine Long Arc Quad: AAROM;Right;10 reps Knee Flexion: Right;10 reps;Seated;AROM Goniometric ROM: R knee 5 to 80 Other Exercises Other Exercises: Tolerated well; incorporated AAROM technique with gait belt with cues    General Comments General comments (skin integrity, edema, etc.): Discussed with pt good progress and safe to d/c home from PT perspective.  Pt expresses that she is hoping to stay another night for meds and rest - notified RN who was reaching out to PA/MD.      Pertinent Vitals/Pain Pain Assessment: 0-10 Pain Score: 7  Pain Location: R knee Pain Descriptors / Indicators: Discomfort;Sore Pain Intervention(s): Limited activity within patient's tolerance;Monitored during session;Premedicated before session;Repositioned;Ice applied    Home Living                          Prior Function            PT Goals (current goals can now be found in the care plan section) Progress towards PT goals: Progressing toward goals    Frequency    7X/week      PT Plan Current plan remains appropriate    Co-evaluation              AM-PAC PT "6 Clicks" Mobility   Outcome Measure  Help needed turning from your back to your side while in a flat bed without using bedrails?: A Little Help needed moving from lying on your back to sitting on the side of a flat bed without using bedrails?: A Little Help needed moving to and from a bed to a chair (including a wheelchair)?: A Little Help needed standing up from a chair using your arms (e.g., wheelchair or bedside chair)?: A Little Help needed to walk in hospital room?: A Little Help needed climbing 3-5 steps with a railing? : A Little 6 Click Score: 18    End of Session Equipment Utilized During Treatment: Gait belt Activity Tolerance: Patient tolerated treatment well Patient left: with call bell/phone within reach;with family/visitor  present;in bed;with bed alarm set Nurse Communication: Mobility status PT Visit Diagnosis: Other abnormalities of gait and mobility (R26.89);Muscle weakness (generalized) (M62.81);Pain Pain - Right/Left: Right Pain - part of body: Knee     Time: 1007-1219 PT Time Calculation (min) (ACUTE ONLY): 38 min  Charges:  $Gait Training: 8-22 mins $Therapeutic Exercise: 8-22 mins $Therapeutic Activity: 8-22 mins                     Anise Salvo, PT Acute Rehab Services Pager [phone] [facility] [phone]    Mercedes Lucas 01/26/2021, 3:49 PM

## 2021-01-26 NOTE — Progress Notes (Signed)
Subjective: 2 Days Post-Op Procedure(s) (LRB): TOTAL KNEE ARTHROPLASTY (Right) Patient reports pain as mild.   Patient seen in rounds for Dr. Charlann Boxer. Patient is well, and has had no acute complaints or problems. No acute events overnight. Voiding without difficulty. Patient ambulated 60 feet with PT. She reports she does want to get home but is concerned about her mobility at this point as well as pain. We will continue therapy today.   Objective: Vital signs in last 24 hours: Temp:  [97.9 F (36.6 C)-98.9 F (37.2 C)] 98.9 F (37.2 C) (12/15 0437) Pulse Rate:  [59-71] 66 (12/15 0437) Resp:  [18] 18 (12/15 0437) BP: (132-166)/(73-78) 143/78 (12/15 0437) SpO2:  [90 %-98 %] 90 % (12/15 0437)  Intake/Output from previous day:  Intake/Output Summary (Last 24 hours) at 01/26/2021 0741 Last data filed at 01/26/2021 0618 Gross per 24 hour  Intake 1475.27 ml  Output 750 ml  Net 725.27 ml     Intake/Output this shift: No intake/output data recorded.  Labs: Recent Labs    01/25/21 0334 01/26/21 0317  HGB 10.7* 10.8*   Recent Labs    01/25/21 0334 01/26/21 0317  WBC 20.0* 15.6*  RBC 3.86* 4.00  HCT 33.5* 34.3*  PLT 213 212   Recent Labs    01/25/21 0334  NA 136  K 3.7  CL 103  CO2 25  BUN 15  CREATININE 0.67  GLUCOSE 191*  CALCIUM 9.7   No results for input(s): LABPT, INR in the last 72 hours.  Exam: General - Patient is Alert and Oriented Extremity - Neurologically intact Sensation intact distally Intact pulses distally Dorsiflexion/Plantar flexion intact Dressing - dressing C/D/I Motor Function - intact, moving foot and toes well on exam.   Past Medical History:  Diagnosis Date   Allergy    Anemia    Arthritis    Asthma    "mild-no attacks"   Headache    migraines occasional   Hypercholesteremia    Hypertension    Pneumonia    PONV (postoperative nausea and vomiting)    Pre-diabetes     Assessment/Plan: 2 Days Post-Op Procedure(s)  (LRB): TOTAL KNEE ARTHROPLASTY (Right) Principal Problem:   S/P total knee arthroplasty, right  Estimated body mass index is 28.76 kg/m as calculated from the following:   Height as of this encounter: 5\' 5"  (1.651 m).   Weight as of this encounter: 78.4 kg. Advance diet Up with therapy D/C IV fluids   Patient's anticipated LOS is less than 2 midnights, meeting these requirements: - Younger than 6 - Lives within 1 hour of care - Has a competent adult at home to recover with post-op recover - NO history of  - Chronic pain requiring opiods  - Diabetes  - Coronary Artery Disease  - Heart failure  - Heart attack  - Stroke  - DVT/VTE  - Cardiac arrhythmia  - Respiratory Failure/COPD  - Renal failure  - Anemia  - Advanced Liver disease     DVT Prophylaxis - Aspirin Weight bearing as tolerated.  Plan to get up with PT again today.  She is on hydroxyzine for itching, and I discussed with her RN who reports she did not complain about this last night.   She is having difficulty managing her pain. She reports most significant pain in the inner thigh, so we will change her muscle relaxant to flexeril. She has not been getting frequent dosing of dilaudid due to lethargy, which was also the case when she  took Oxycodone with her previous knee replacement.   If clinical improvement, plan to discharge home today.    Dennie Bible, PA-C Orthopedic Surgery [phone] 01/26/2021, 7:41 AM

## 2021-01-26 NOTE — Progress Notes (Signed)
Physical Therapy Treatment Patient Details Name: Mercedes Lucas MRN: [id number] DOB: December 06, 1955 Today's Date: 01/26/2021   History of Present Illness Pt is 65 yo female admitted 01/24/21 for R TKA.  Pt with hx including HTN, L TKA 2016, arthritis, and allergies.    PT Comments    Pt still reports pain at 10/10 but she demonstrated less symptoms  of pain and was very lethargic/decreased alertness.  Pt frequently closing eyes and nearly falling asleep even with gait.  Requiring min A and mod cues.  She is moving well (good ROM, quad activation, transfers) otherwise. Will benefit from further therapy as unsafe to return home at current alertness level.      Recommendations for follow up therapy are one component of a multi-disciplinary discharge planning process, led by the attending physician.  Recommendations may be updated based on patient status, additional functional criteria and insurance authorization.  Follow Up Recommendations  Follow physician's recommendations for discharge plan and follow up therapies     Assistance Recommended at Discharge Frequent or constant Supervision/Assistance  Equipment Recommendations  None recommended by PT    Recommendations for Other Services       Precautions / Restrictions Precautions Precautions: Fall Restrictions RLE Weight Bearing: Weight bearing as tolerated     Mobility  Bed Mobility Overal bed mobility: Needs Assistance Bed Mobility: Sit to Supine     Supine to sit: Supervision;HOB elevated     General bed mobility comments: Pt self assisted R LE with L LE    Transfers Overall transfer level: Needs assistance Equipment used: Rolling walker (2 wheels) Transfers: Sit to/from Stand Sit to Stand: Min guard           General transfer comment: min guard for safety    Ambulation/Gait Ambulation/Gait assistance: Min assist Gait Distance (Feet): 80 Feet   Gait Pattern/deviations: Step-to pattern;Decreased stride  length;Decreased weight shift to right;Antalgic Gait velocity: decreased     General Gait Details: Frequent cues for sequencing (step to R for pain control) and RW proximity (tends to get too close); Also , pt closing eyes and nearly falling asleep requiring cues to keep eyes open.  Encouraged pt to talk to therapist to help keep awake.  If pt distracted or took hands off walker would lean posteriorly requiring min A   Stairs Stairs:  (unsafe to attempt)           Wheelchair Mobility    Modified Rankin (Stroke Patients Only)       Balance Overall balance assessment: Needs assistance Sitting-balance support: No upper extremity supported Sitting balance-Leahy Scale: Good   Postural control: Posterior lean Standing balance support: Bilateral upper extremity supported;Reliant on assistive device for balance Standing balance-Leahy Scale: Poor Standing balance comment: Requiring RW and min A at time s- due to lethargy                            Cognition Arousal/Alertness: Lethargic;Suspect due to medications Behavior During Therapy: [facility] for tasks assessed/performed Overall Cognitive Status: Impaired/Different from baseline Area of Impairment: Memory;Safety/judgement;Awareness                     Memory: Decreased short-term memory   Safety/Judgement: Decreased awareness of safety Awareness: Emergent   General Comments: Pt limited due to lethargy and "out of it, like I'm high."  Pt closing eyes while walking, falling asleep during exercises, and nearly falling asleep walking.  After walking short distance  and return to room pt stated "I'm already back, did I do the whole loop" ; Suspect medication related - discussed with RN        Exercises Total Joint Exercises Ankle Circles/Pumps: AROM;Both;10 reps;Seated Quad Sets: AROM;Both;10 reps;Seated Hip ABduction/ADduction: AAROM;Right;10 reps;Seated Long Arc Quad: AAROM;Right;10 reps Knee Flexion:  AAROM;Right;10 reps;Seated Goniometric ROM: R knee 5 to 80 Other Exercises Other Exercises: Tolerating exercises well but requiring frequent cues for alertness    General Comments        Pertinent Vitals/Pain Pain Assessment: 0-10 Pain Score: 10-Worst pain ever Pain Location: R knee Pain Descriptors / Indicators: Other (Comment) (Pt reports pain still as severe as yesterday, but she was less symptomatic today - falling asleep walking and with exercises, not restless or rubbing knee) Pain Intervention(s): Limited activity within patient's tolerance;Monitored during session;Premedicated before session;Ice applied    Home Living                          Prior Function            PT Goals (current goals can now be found in the care plan section) Progress towards PT goals: Not progressing toward goals - comment (limited by lethargy)    Frequency    7X/week      PT Plan Current plan remains appropriate    Co-evaluation              AM-PAC PT "6 Clicks" Mobility   Outcome Measure  Help needed turning from your back to your side while in a flat bed without using bedrails?: A Little Help needed moving from lying on your back to sitting on the side of a flat bed without using bedrails?: A Little Help needed moving to and from a bed to a chair (including a wheelchair)?: A Little Help needed standing up from a chair using your arms (e.g., wheelchair or bedside chair)?: A Little Help needed to walk in hospital room?: A Little Help needed climbing 3-5 steps with a railing? : A Lot 6 Click Score: 17    End of Session Equipment Utilized During Treatment: Gait belt Activity Tolerance: Patient limited by lethargy Patient left: with call bell/phone within reach;with chair alarm set;in chair;with family/visitor present Nurse Communication: Mobility status (lethargy/pain meds) PT Visit Diagnosis: Other abnormalities of gait and mobility (R26.89);Muscle weakness  (generalized) (M62.81);Pain Pain - Right/Left: Right Pain - part of body: Knee     Time: 1240-1307 PT Time Calculation (min) (ACUTE ONLY): 27 min  Charges:  $Gait Training: 8-22 mins $Therapeutic Exercise: 8-22 mins                     Anise Salvo, PT Acute Rehab Services Pager [phone] [facility] [phone]    Rayetta Humphrey 01/26/2021, 1:26 PM

## 2021-01-26 NOTE — Progress Notes (Signed)
Patient continues to have difficulty with pain control which puts her at risk for fall at home. We will keep her in the hospital overnight to continue working towards improved pain management with a goal of discharge home tomorrow afternoon.  Rosalene Billings, PA-C Orthopedic Surgery [facility] [place] [phone]

## 2021-01-27 MED ORDER — HYDROMORPHONE HCL 2 MG PO TABS: 2.0000 mg | ORAL_TABLET | Freq: Four times a day (QID) | ORAL | 0 refills | Status: DC | PRN

## 2021-01-27 NOTE — Progress Notes (Signed)
° °  Subjective: 3 Days Post-Op Procedure(s) (LRB): TOTAL KNEE ARTHROPLASTY (Right) Patient reports pain as mild.   Patient seen in rounds with Dr. Charlann Boxer. Patient is well, and has had no acute complaints or problems. No acute events overnight. Voiding without difficulty. Patient ambulated 150+ feet with PT. She feels she made huge improvement in terms of pain and function yesterday, and does feel ready to go home today. She is still having itching, but it has improved some.  We will continue therapy today.   Objective: Vital signs in last 24 hours: Temp:  [98 F (36.7 C)-98.4 F (36.9 C)] 98.2 F (36.8 C) (12/16 0600) Pulse Rate:  [66-77] 69 (12/16 0600) Resp:  [16-18] 18 (12/16 0600) BP: (133-155)/(63-73) 155/68 (12/16 0600) SpO2:  [90 %-95 %] 91 % (12/16 0600)  Intake/Output from previous day:  Intake/Output Summary (Last 24 hours) at 01/27/2021 0738 Last data filed at 01/27/2021 0600 Gross per 24 hour  Intake 360 ml  Output 100 ml  Net 260 ml     Intake/Output this shift: No intake/output data recorded.  Labs: Recent Labs    01/25/21 0334 01/26/21 0317  HGB 10.7* 10.8*   Recent Labs    01/25/21 0334 01/26/21 0317  WBC 20.0* 15.6*  RBC 3.86* 4.00  HCT 33.5* 34.3*  PLT 213 212   Recent Labs    01/25/21 0334  NA 136  K 3.7  CL 103  CO2 25  BUN 15  CREATININE 0.67  GLUCOSE 191*  CALCIUM 9.7   No results for input(s): LABPT, INR in the last 72 hours.  Exam: General - Patient is Alert and Oriented Extremity - Neurologically intact Sensation intact distally Intact pulses distally Dorsiflexion/Plantar flexion intact Dressing - dressing C/D/I Motor Function - intact, moving foot and toes well on exam.   Past Medical History:  Diagnosis Date   Allergy    Anemia    Arthritis    Asthma    "mild-no attacks"   Headache    migraines occasional   Hypercholesteremia    Hypertension    Pneumonia    PONV (postoperative nausea and vomiting)     Pre-diabetes     Assessment/Plan: 3 Days Post-Op Procedure(s) (LRB): TOTAL KNEE ARTHROPLASTY (Right) Principal Problem:   S/P total knee arthroplasty, right  Estimated body mass index is 28.76 kg/m as calculated from the following:   Height as of this encounter: 5\' 5"  (1.651 m).   Weight as of this encounter: 78.4 kg. Advance diet Up with therapy D/C IV fluids    DVT Prophylaxis - Aspirin Weight bearing as tolerated.  Plan is to go Home after hospital stay. Plan for discharge today following 1-2 sessions of PT as long as they are meeting their goals. Patient is scheduled for OPPT. Follow up in the office in 2 weeks.   , PA-C Orthopedic Surgery [phone] 01/27/2021, 7:38 AM

## 2021-01-27 NOTE — Progress Notes (Signed)
Physical Therapy Treatment Patient Details Name: Mercedes Lucas MRN: [id number] DOB: 04-16-1955 Today's Date: 01/27/2021   History of Present Illness Pt is 65 yo female admitted 01/24/21 for R TKA.  Pt with hx including HTN, L TKA 2016, arthritis, and allergies.    PT Comments    Pt able to self assist RLE into/out of bed using LLE. Pt powers to stand with BUE assisting, maintains RLE slightly extended. Pt ambulates 200 ft with RW, significantly decreased cadence, step to pattern, decreased weight-shift R, no LOB or knee buckling noted. Educated pt on RLE positioning with tranfers, RW management while ambulating, sequencing with turning with good carryover. Pt denies questions regarding HEP and declines additional stair training. Pt's spouse in room providing encouragement; pt with good family support and ready to d/c home with spouse to assist as needed. All education complete and questions answered- RN notified.   Recommendations for follow up therapy are one component of a multi-disciplinary discharge planning process, led by the attending physician.  Recommendations may be updated based on patient status, additional functional criteria and insurance authorization.  Follow Up Recommendations  Follow physician's recommendations for discharge plan and follow up therapies     Assistance Recommended at Discharge Frequent or constant Supervision/Assistance  Equipment Recommendations  None recommended by PT    Recommendations for Other Services       Precautions / Restrictions Precautions Precautions: Fall Restrictions Weight Bearing Restrictions: No RLE Weight Bearing: Weight bearing as tolerated     Mobility  Bed Mobility Overal bed mobility: Modified Independent Bed Mobility: Supine to Sit;Sit to Supine  Supine to sit: Modified independent (Device/Increase time) Sit to supine: Modified independent (Device/Increase time)  General bed mobility comments: increased time, using  LLE to assist RLE out of and back into bed, no physical assist or cues    Transfers Overall transfer level: Needs assistance Equipment used: Rolling walker (2 wheels) Transfers: Sit to/from Stand Sit to Stand: Supervision  General transfer comment: powers to stand with BUE assisting, RLE slightly extended for comfort, no physical assist    Ambulation/Gait Ambulation/Gait assistance: Supervision Gait Distance (Feet): 200 Feet Assistive device: Rolling walker (2 wheels) Gait Pattern/deviations: Step-to pattern;Decreased stance time - right;Decreased weight shift to right Gait velocity: decreased  General Gait Details: step to pattern with occaisonal R toe initial contact versus heel per pt preference for comfort, VC for RW management closer to body and shorter steps for comfort, increased time and cues to maintain feel paralel with turns for comfort with good carryover, significantly increased time   Stairs  General stair comments: pt declines stair training, reports "I got it" from yesterday's training- spouse in room in agreement, no stair further training needed   Wheelchair Mobility    Modified Rankin (Stroke Patients Only)       Balance Overall balance assessment: Needs assistance Sitting-balance support: No upper extremity supported Sitting balance-Leahy Scale: Good  Standing balance support: Bilateral upper extremity supported;Reliant on assistive device for balance;During functional activity Standing balance-Leahy Scale: Poor Standing balance comment: static able to release UE from RW, dynamic with RW     Cognition Arousal/Alertness: Awake/alert Behavior During Therapy: WFL for tasks assessed/performed Overall Cognitive Status: Within Functional Limits for tasks assessed  General Comments: Pt alert and oriented, spouse also in room reporting pt "clear" today- reports hasn't taken any muscle relaxers this morning and thinks that is helping        Exercises       General Comments  Pertinent Vitals/Pain Pain Assessment: 0-10 Pain Score: 7  Pain Location: R knee Pain Descriptors / Indicators: Discomfort;Sore Pain Intervention(s): Limited activity within patient's tolerance;Monitored during session;Repositioned;Patient requesting pain meds-RN notified;Ice applied    Home Living                          Prior Function            PT Goals (current goals can now be found in the care plan section) Acute Rehab PT Goals Patient Stated Goal: decrease itching PT Goal Formulation: With patient Time For Goal Achievement: 02/08/21 Potential to Achieve Goals: Good Additional Goals Additional Goal #1: Pain will decrease to 5/10 to allow for improved ambulation Progress towards PT goals: Progressing toward goals    Frequency    7X/week      PT Plan Current plan remains appropriate    Co-evaluation              AM-PAC PT "6 Clicks" Mobility   Outcome Measure  Help needed turning from your back to your side while in a flat bed without using bedrails?: A Little Help needed moving from lying on your back to sitting on the side of a flat bed without using bedrails?: A Little Help needed moving to and from a bed to a chair (including a wheelchair)?: A Little Help needed standing up from a chair using your arms (e.g., wheelchair or bedside chair)?: A Little Help needed to walk in hospital room?: A Little Help needed climbing 3-5 steps with a railing? : A Little 6 Click Score: 18    End of Session Equipment Utilized During Treatment: Gait belt Activity Tolerance: Patient tolerated treatment well Patient left: in bed;with call bell/phone within reach;with nursing/sitter in room;with family/visitor present Nurse Communication: Mobility status;Patient requests pain meds PT Visit Diagnosis: Other abnormalities of gait and mobility (R26.89);Muscle weakness (generalized) (M62.81);Pain Pain - Right/Left: Right Pain - part of  body: Knee     Time: 0927-1001 PT Time Calculation (min) (ACUTE ONLY): 34 min  Charges:  $Gait Training: 8-22 mins $Therapeutic Activity: 8-22 mins                      Tori Farrie Sann PT, DPT 01/27/21, 10:16 AM

## 2021-01-27 NOTE — Plan of Care (Signed)
Patient discharged home. Haydee Salter, RN 01/27/21 12:03 PM

## 2021-01-30 DIAGNOSIS — M25661 Stiffness of right knee, not elsewhere classified: Secondary | ICD-10-CM | POA: Diagnosis not present

## 2021-01-30 DIAGNOSIS — M25561 Pain in right knee: Secondary | ICD-10-CM | POA: Diagnosis not present

## 2021-02-01 DIAGNOSIS — M25661 Stiffness of right knee, not elsewhere classified: Secondary | ICD-10-CM | POA: Diagnosis not present

## 2021-02-01 DIAGNOSIS — M25561 Pain in right knee: Secondary | ICD-10-CM | POA: Diagnosis not present

## 2021-02-03 DIAGNOSIS — M25561 Pain in right knee: Secondary | ICD-10-CM | POA: Diagnosis not present

## 2021-02-03 DIAGNOSIS — M25661 Stiffness of right knee, not elsewhere classified: Secondary | ICD-10-CM | POA: Diagnosis not present

## 2021-02-03 NOTE — Discharge Summary (Signed)
Patient ID: Mercedes Lucas MRN: [id number] DOB/AGE: 06-26-1955 65 y.o.  Admit date: 01/24/2021 Discharge date: 02/03/2021  Admission Diagnoses:  Right knee osteoarthritis.    Discharge Diagnoses:  Same  Past Medical History:  Diagnosis Date   Allergy    Anemia    Arthritis    Asthma    "mild-no attacks"   Headache    migraines occasional   Hypercholesteremia    Hypertension    Pneumonia    PONV (postoperative nausea and vomiting)    Pre-diabetes     Surgeries: Procedure(s): TOTAL KNEE ARTHROPLASTY on 01/24/2021   Consultants:   Discharged Condition: Improved  Hospital Course: Mercedes Lucas is an 65 y.o. female who was admitted 01/24/2021 for operative treatment ofS/P total knee arthroplasty, right. Patient has severe unremitting pain that affects sleep, daily activities, and work/hobbies. After pre-op clearance the patient was taken to the operating room on 01/24/2021 and underwent  Procedure(s): TOTAL KNEE ARTHROPLASTY.    Patient was given perioperative antibiotics:  Anti-infectives (From admission, onward)    Start     Dose/Rate Route Frequency Ordered Stop   01/24/21 1630  ceFAZolin (ANCEF) IVPB 2g/100 mL premix        2 g 200 mL/hr over 30 Minutes Intravenous Every 6 hours 01/24/21 1516 01/24/21 2207   01/24/21 0745  ceFAZolin (ANCEF) IVPB 2g/100 mL premix        2 g 200 mL/hr over 30 Minutes Intravenous On call to O.R. 01/24/21 0733 01/24/21 1017        Patient was given sequential compression devices, early ambulation, and chemoprophylaxis to prevent DVT. Patient worked with PT and was meeting their goals regarding safe ambulation and transfers.  Patient benefited maximally from hospital stay and there were no complications.    Recent vital signs: No data found.   Recent laboratory studies: No results for input(s): WBC, HGB, HCT, PLT, NA, K, CL, CO2, BUN, CREATININE, GLUCOSE, INR, CALCIUM in the last 72 hours.  Invalid input(s): PT,  2   Discharge Medications:   Allergies as of 01/27/2021       Reactions   Bee Venom Anaphylaxis   Erythromycin Anaphylaxis, Nausea Only   Codeine Nausea Only   Adhesive [tape] Rash   "From bandaids"        Medication List     STOP taking these medications    meloxicam 15 MG tablet Commonly known as: MOBIC       TAKE these medications    albuterol 108 (90 Base) MCG/ACT inhaler Commonly known as: VENTOLIN HFA Inhale 1-2 puffs into the lungs every 6 (six) hours as needed for wheezing.   amLODipine 5 MG tablet Commonly known as: NORVASC TAKE 1 TABLET(5 MG) BY MOUTH DAILY   aspirin 81 MG chewable tablet Chew 1 tablet (81 mg total) by mouth 2 (two) times daily for 28 days.   atorvastatin 40 MG tablet Commonly known as: LIPITOR Take 1 tablet (40 mg total) by mouth daily. What changed: when to take this   celecoxib 200 MG capsule Commonly known as: CELEBREX Take 1 capsule (200 mg total) by mouth 2 (two) times daily.   docusate sodium 100 MG capsule Commonly known as: COLACE Take 1 capsule (100 mg total) by mouth 2 (two) times daily.   escitalopram 20 MG tablet Commonly known as: LEXAPRO Take 20 mg by mouth daily. Noon   estradiol 2 MG tablet Commonly known as: ESTRACE Take 2 mg by mouth daily.   Fluticasone-Salmeterol 100-50 MCG/DOSE Aepb  Commonly known as: ADVAIR Inhale 1 puff into the lungs daily as needed (Shortness of breath).   hydrochlorothiazide 12.5 MG tablet Commonly known as: HYDRODIURIL Take 12.5 mg by mouth daily.   HYDROmorphone 2 MG tablet Commonly known as: DILAUDID Take 1-2 tablets (2-4 mg total) by mouth every 6 (six) hours as needed for severe pain.   hydrOXYzine 25 MG tablet Commonly known as: ATARAX Take 1 tablet (25 mg total) by mouth 3 (three) times daily as needed for itching.   lisinopril 40 MG tablet Commonly known as: ZESTRIL Take 40 mg by mouth daily.   metFORMIN 500 MG tablet Commonly known as: GLUCOPHAGE Take 500  mg by mouth daily with breakfast.   methocarbamol 500 MG tablet Commonly known as: ROBAXIN Take 1 tablet (500 mg total) by mouth every 6 (six) hours as needed for muscle spasms.   montelukast 10 MG tablet Commonly known as: SINGULAIR Take 1 tablet (10 mg total) by mouth at bedtime.   polyethylene glycol 17 g packet Commonly known as: MIRALAX / GLYCOLAX Take 17 g by mouth daily as needed for mild constipation.               Discharge Care Instructions  (From admission, onward)           Start     Ordered   01/25/21 0000  Change dressing       Comments: Maintain surgical dressing until follow up in the clinic. If the edges start to pull up, may reinforce with tape. If the dressing is no longer working, may remove and cover with gauze and tape, but must keep the area dry and clean.  Call with any questions or concerns.   01/25/21 0826            Diagnostic Studies: No results found.  Disposition: Discharge disposition: 01-Home or Self Care       Discharge Instructions     Call MD / Call 911   Complete by: As directed    If you experience chest pain or shortness of breath, CALL 911 and be transported to the hospital emergency room.  If you develope a fever above 101 F, pus (white drainage) or increased drainage or redness at the wound, or calf pain, call your surgeon's office.   Change dressing   Complete by: As directed    Maintain surgical dressing until follow up in the clinic. If the edges start to pull up, may reinforce with tape. If the dressing is no longer working, may remove and cover with gauze and tape, but must keep the area dry and clean.  Call with any questions or concerns.   Constipation Prevention   Complete by: As directed    Drink plenty of fluids.  Prune juice may be helpful.  You may use a stool softener, such as Colace (over the counter) 100 mg twice a day.  Use MiraLax (over the counter) for constipation as needed.   Diet - low sodium  heart healthy   Complete by: As directed    Increase activity slowly as tolerated   Complete by: As directed    Weight bearing as tolerated with assist device (walker, cane, etc) as directed, use it as long as suggested by your surgeon or therapist, typically at least 4-6 weeks.   Post-operative opioid taper instructions:   Complete by: As directed    POST-OPERATIVE OPIOID TAPER INSTRUCTIONS: It is important to wean off of your opioid medication as soon as possible. If  you do not need pain medication after your surgery it is ok to stop day one. Opioids include: Codeine, Hydrocodone(Norco, Vicodin), Oxycodone(Percocet, oxycontin) and hydromorphone amongst others.  Long term and even short term use of opiods can cause: Increased pain response Dependence Constipation Depression Respiratory depression And more.  Withdrawal symptoms can include Flu like symptoms Nausea, vomiting And more Techniques to manage these symptoms Hydrate well Eat regular healthy meals Stay active Use relaxation techniques(deep breathing, meditating, yoga) Do Not substitute Alcohol to help with tapering If you have been on opioids for less than two weeks and do not have pain than it is ok to stop all together.  Plan to wean off of opioids This plan should start within one week post op of your joint replacement. Maintain the same interval or time between taking each dose and first decrease the dose.  Cut the total daily intake of opioids by one tablet each day Next start to increase the time between doses. The last dose that should be eliminated is the evening dose.      TED hose   Complete by: As directed    Use stockings (TED hose) for 2 weeks on both leg(s).  You may remove them at night for sleeping.        Follow-up Information     Cassandria Anger, PA-C. Go on 02/08/2021.   Specialty: Orthopedic Surgery Why: You are scheduled for a follow up appointment on 02-08-21 at 9:30 am. Contact  information: [address] [place] [address] [phone]                  Signed: Cassandria Anger 02/03/2021, 9:40 AM

## 2021-02-07 DIAGNOSIS — M25661 Stiffness of right knee, not elsewhere classified: Secondary | ICD-10-CM | POA: Diagnosis not present

## 2021-02-07 DIAGNOSIS — M25561 Pain in right knee: Secondary | ICD-10-CM | POA: Diagnosis not present

## 2021-02-08 DIAGNOSIS — Z4789 Encounter for other orthopedic aftercare: Secondary | ICD-10-CM | POA: Diagnosis not present

## 2021-02-14 DIAGNOSIS — M25661 Stiffness of right knee, not elsewhere classified: Secondary | ICD-10-CM | POA: Diagnosis not present

## 2021-02-14 DIAGNOSIS — M25561 Pain in right knee: Secondary | ICD-10-CM | POA: Diagnosis not present

## 2021-02-16 DIAGNOSIS — M25661 Stiffness of right knee, not elsewhere classified: Secondary | ICD-10-CM | POA: Diagnosis not present

## 2021-02-16 DIAGNOSIS — M25561 Pain in right knee: Secondary | ICD-10-CM | POA: Diagnosis not present

## 2021-02-21 DIAGNOSIS — M25561 Pain in right knee: Secondary | ICD-10-CM | POA: Diagnosis not present

## 2021-02-21 DIAGNOSIS — M25661 Stiffness of right knee, not elsewhere classified: Secondary | ICD-10-CM | POA: Diagnosis not present

## 2021-03-15 DIAGNOSIS — Z471 Aftercare following joint replacement surgery: Secondary | ICD-10-CM | POA: Diagnosis not present

## 2021-03-15 DIAGNOSIS — Z96651 Presence of right artificial knee joint: Secondary | ICD-10-CM | POA: Diagnosis not present

## 2021-03-15 DIAGNOSIS — Z4789 Encounter for other orthopedic aftercare: Secondary | ICD-10-CM | POA: Diagnosis not present

## 2021-04-21 DIAGNOSIS — T3695XA Adverse effect of unspecified systemic antibiotic, initial encounter: Secondary | ICD-10-CM | POA: Diagnosis not present

## 2021-04-21 DIAGNOSIS — I10 Essential (primary) hypertension: Secondary | ICD-10-CM | POA: Diagnosis not present

## 2021-04-21 DIAGNOSIS — J4520 Mild intermittent asthma, uncomplicated: Secondary | ICD-10-CM | POA: Diagnosis not present

## 2021-04-21 DIAGNOSIS — J301 Allergic rhinitis due to pollen: Secondary | ICD-10-CM | POA: Diagnosis not present

## 2021-04-21 DIAGNOSIS — B379 Candidiasis, unspecified: Secondary | ICD-10-CM | POA: Diagnosis not present

## 2021-04-21 DIAGNOSIS — J019 Acute sinusitis, unspecified: Secondary | ICD-10-CM | POA: Diagnosis not present

## 2021-05-05 ENCOUNTER — Ambulatory Visit: Payer: Medicare Other | Admitting: Internal Medicine

## 2021-08-11 DIAGNOSIS — R7303 Prediabetes: Secondary | ICD-10-CM | POA: Diagnosis not present

## 2021-08-11 DIAGNOSIS — J4520 Mild intermittent asthma, uncomplicated: Secondary | ICD-10-CM | POA: Diagnosis not present

## 2021-08-11 DIAGNOSIS — Z0000 Encounter for general adult medical examination without abnormal findings: Secondary | ICD-10-CM | POA: Diagnosis not present

## 2021-08-11 DIAGNOSIS — J301 Allergic rhinitis due to pollen: Secondary | ICD-10-CM | POA: Diagnosis not present

## 2021-08-11 DIAGNOSIS — E213 Hyperparathyroidism, unspecified: Secondary | ICD-10-CM | POA: Diagnosis not present

## 2021-08-11 DIAGNOSIS — Z8639 Personal history of other endocrine, nutritional and metabolic disease: Secondary | ICD-10-CM | POA: Diagnosis not present

## 2021-08-11 DIAGNOSIS — M25562 Pain in left knee: Secondary | ICD-10-CM | POA: Diagnosis not present

## 2021-08-11 DIAGNOSIS — M25561 Pain in right knee: Secondary | ICD-10-CM | POA: Diagnosis not present

## 2021-08-11 DIAGNOSIS — F17210 Nicotine dependence, cigarettes, uncomplicated: Secondary | ICD-10-CM | POA: Diagnosis not present

## 2021-08-11 DIAGNOSIS — E7800 Pure hypercholesterolemia, unspecified: Secondary | ICD-10-CM | POA: Diagnosis not present

## 2021-08-11 DIAGNOSIS — I10 Essential (primary) hypertension: Secondary | ICD-10-CM | POA: Diagnosis not present

## 2021-08-29 ENCOUNTER — Other Ambulatory Visit: Payer: Self-pay | Admitting: Family Medicine

## 2021-08-29 DIAGNOSIS — Z1231 Encounter for screening mammogram for malignant neoplasm of breast: Secondary | ICD-10-CM

## 2021-09-12 ENCOUNTER — Ambulatory Visit
Admission: RE | Admit: 2021-09-12 | Discharge: 2021-09-12 | Disposition: A | Discharge status: Home / Self Care | Payer: Medicare Other | Source: Ambulatory Visit | Attending: Family Medicine | Admitting: Family Medicine

## 2021-09-12 DIAGNOSIS — Z1231 Encounter for screening mammogram for malignant neoplasm of breast: Secondary | ICD-10-CM | POA: Diagnosis not present

## 2021-09-15 ENCOUNTER — Other Ambulatory Visit: Payer: Self-pay | Admitting: Family Medicine

## 2021-09-15 DIAGNOSIS — R928 Other abnormal and inconclusive findings on diagnostic imaging of breast: Secondary | ICD-10-CM

## 2021-09-21 ENCOUNTER — Ambulatory Visit
Admission: RE | Admit: 2021-09-21 | Discharge: 2021-09-21 | Disposition: A | Discharge status: Home / Self Care | Payer: Medicare Other | Source: Ambulatory Visit | Attending: Family Medicine | Admitting: Family Medicine

## 2021-09-21 ENCOUNTER — Other Ambulatory Visit: Payer: Self-pay | Admitting: Family Medicine

## 2021-09-21 DIAGNOSIS — R921 Mammographic calcification found on diagnostic imaging of breast: Secondary | ICD-10-CM | POA: Diagnosis not present

## 2021-09-21 DIAGNOSIS — R928 Other abnormal and inconclusive findings on diagnostic imaging of breast: Secondary | ICD-10-CM

## 2021-10-12 ENCOUNTER — Ambulatory Visit
Admission: RE | Admit: 2021-10-12 | Discharge: 2021-10-12 | Disposition: A | Discharge status: Home / Self Care | Payer: Medicare Other | Source: Ambulatory Visit | Attending: Family Medicine | Admitting: Family Medicine

## 2021-10-12 ENCOUNTER — Other Ambulatory Visit: Payer: Self-pay | Admitting: Family Medicine

## 2021-10-12 DIAGNOSIS — R921 Mammographic calcification found on diagnostic imaging of breast: Secondary | ICD-10-CM

## 2021-10-12 DIAGNOSIS — N6011 Diffuse cystic mastopathy of right breast: Secondary | ICD-10-CM | POA: Diagnosis not present

## 2021-10-12 HISTORY — PX: BREAST BIOPSY: SHX20

## 2021-11-02 DIAGNOSIS — H25813 Combined forms of age-related cataract, bilateral: Secondary | ICD-10-CM | POA: Diagnosis not present

## 2021-11-03 DIAGNOSIS — H2513 Age-related nuclear cataract, bilateral: Secondary | ICD-10-CM | POA: Diagnosis not present

## 2021-12-05 DIAGNOSIS — H2512 Age-related nuclear cataract, left eye: Secondary | ICD-10-CM | POA: Diagnosis not present

## 2021-12-06 DIAGNOSIS — H2511 Age-related nuclear cataract, right eye: Secondary | ICD-10-CM | POA: Diagnosis not present

## 2021-12-19 DIAGNOSIS — H2511 Age-related nuclear cataract, right eye: Secondary | ICD-10-CM | POA: Diagnosis not present

## 2022-02-22 DIAGNOSIS — Z780 Asymptomatic menopausal state: Secondary | ICD-10-CM | POA: Diagnosis not present

## 2022-02-22 DIAGNOSIS — I10 Essential (primary) hypertension: Secondary | ICD-10-CM | POA: Diagnosis not present

## 2022-02-22 DIAGNOSIS — Z23 Encounter for immunization: Secondary | ICD-10-CM | POA: Diagnosis not present

## 2022-02-22 DIAGNOSIS — F17210 Nicotine dependence, cigarettes, uncomplicated: Secondary | ICD-10-CM | POA: Diagnosis not present

## 2022-02-22 DIAGNOSIS — J4520 Mild intermittent asthma, uncomplicated: Secondary | ICD-10-CM | POA: Diagnosis not present

## 2022-02-22 DIAGNOSIS — R7303 Prediabetes: Secondary | ICD-10-CM | POA: Diagnosis not present

## 2022-02-22 DIAGNOSIS — Z8639 Personal history of other endocrine, nutritional and metabolic disease: Secondary | ICD-10-CM | POA: Diagnosis not present

## 2022-02-22 DIAGNOSIS — M25569 Pain in unspecified knee: Secondary | ICD-10-CM | POA: Diagnosis not present

## 2022-02-22 DIAGNOSIS — J301 Allergic rhinitis due to pollen: Secondary | ICD-10-CM | POA: Diagnosis not present

## 2022-02-22 DIAGNOSIS — E7800 Pure hypercholesterolemia, unspecified: Secondary | ICD-10-CM | POA: Diagnosis not present

## 2022-02-22 DIAGNOSIS — E213 Hyperparathyroidism, unspecified: Secondary | ICD-10-CM | POA: Diagnosis not present

## 2022-08-23 DIAGNOSIS — Z0000 Encounter for general adult medical examination without abnormal findings: Secondary | ICD-10-CM | POA: Diagnosis not present

## 2022-08-23 DIAGNOSIS — E213 Hyperparathyroidism, unspecified: Secondary | ICD-10-CM | POA: Diagnosis not present

## 2022-08-23 DIAGNOSIS — Z780 Asymptomatic menopausal state: Secondary | ICD-10-CM | POA: Diagnosis not present

## 2022-08-23 DIAGNOSIS — J301 Allergic rhinitis due to pollen: Secondary | ICD-10-CM | POA: Diagnosis not present

## 2022-08-23 DIAGNOSIS — I10 Essential (primary) hypertension: Secondary | ICD-10-CM | POA: Diagnosis not present

## 2022-08-23 DIAGNOSIS — R7303 Prediabetes: Secondary | ICD-10-CM | POA: Diagnosis not present

## 2022-08-23 DIAGNOSIS — F17210 Nicotine dependence, cigarettes, uncomplicated: Secondary | ICD-10-CM | POA: Diagnosis not present

## 2022-08-23 DIAGNOSIS — G4700 Insomnia, unspecified: Secondary | ICD-10-CM | POA: Diagnosis not present

## 2022-08-23 DIAGNOSIS — E7800 Pure hypercholesterolemia, unspecified: Secondary | ICD-10-CM | POA: Diagnosis not present

## 2022-08-23 DIAGNOSIS — Z8639 Personal history of other endocrine, nutritional and metabolic disease: Secondary | ICD-10-CM | POA: Diagnosis not present

## 2022-08-24 ENCOUNTER — Other Ambulatory Visit: Payer: Self-pay | Admitting: Family Medicine

## 2022-08-24 DIAGNOSIS — E2839 Other primary ovarian failure: Secondary | ICD-10-CM

## 2022-10-22 ENCOUNTER — Other Ambulatory Visit: Payer: Self-pay | Admitting: Family Medicine

## 2022-10-22 DIAGNOSIS — Z1231 Encounter for screening mammogram for malignant neoplasm of breast: Secondary | ICD-10-CM

## 2022-10-25 ENCOUNTER — Ambulatory Visit
Admission: RE | Admit: 2022-10-25 | Discharge: 2022-10-25 | Disposition: A | Discharge status: Home / Self Care | Payer: Medicare Other | Source: Ambulatory Visit | Attending: Family Medicine | Admitting: Family Medicine

## 2022-10-25 DIAGNOSIS — Z1231 Encounter for screening mammogram for malignant neoplasm of breast: Secondary | ICD-10-CM | POA: Diagnosis not present

## 2023-03-10 ENCOUNTER — Ambulatory Visit
Admission: EM | Admit: 2023-03-10 | Discharge: 2023-03-10 | Disposition: A | Discharge status: Home / Self Care | Payer: Medicare Other | Attending: Family Medicine | Admitting: Family Medicine

## 2023-03-10 ENCOUNTER — Other Ambulatory Visit: Payer: Self-pay

## 2023-03-10 ENCOUNTER — Ambulatory Visit (INDEPENDENT_AMBULATORY_CARE_PROVIDER_SITE_OTHER): Payer: Medicare Other

## 2023-03-10 DIAGNOSIS — J4520 Mild intermittent asthma, uncomplicated: Secondary | ICD-10-CM

## 2023-03-10 DIAGNOSIS — R0602 Shortness of breath: Secondary | ICD-10-CM

## 2023-03-10 DIAGNOSIS — J4521 Mild intermittent asthma with (acute) exacerbation: Secondary | ICD-10-CM

## 2023-03-10 DIAGNOSIS — J209 Acute bronchitis, unspecified: Secondary | ICD-10-CM | POA: Diagnosis not present

## 2023-03-10 DIAGNOSIS — R051 Acute cough: Secondary | ICD-10-CM | POA: Diagnosis not present

## 2023-03-10 DIAGNOSIS — R059 Cough, unspecified: Secondary | ICD-10-CM | POA: Diagnosis not present

## 2023-03-10 MED ORDER — FLUTICASONE-SALMETEROL 100-50 MCG/DOSE IN AEPB: 1.0000 | INHALATION_SPRAY | Freq: Every day | RESPIRATORY_TRACT | 1 refills | Status: DC | PRN

## 2023-03-10 MED ORDER — PREDNISONE 20 MG PO TABS
40.0000 mg | ORAL_TABLET | Freq: Every day | ORAL | 0 refills | Status: AC
Start: 2023-03-10 — End: 2023-03-15

## 2023-03-10 MED ORDER — PROMETHAZINE-DM 6.25-15 MG/5ML PO SYRP
5.0000 mL | ORAL_SOLUTION | Freq: Four times a day (QID) | ORAL | 0 refills | Status: DC | PRN
Start: 2023-03-10 — End: 2023-12-26

## 2023-03-10 MED ORDER — IPRATROPIUM-ALBUTEROL 0.5-2.5 (3) MG/3ML IN SOLN
3.0000 mL | Freq: Once | RESPIRATORY_TRACT | Status: AC
  Administered 2023-03-10: 3 mL via RESPIRATORY_TRACT

## 2023-03-10 MED ORDER — ALBUTEROL SULFATE HFA 108 (90 BASE) MCG/ACT IN AERS
1.0000 | INHALATION_SPRAY | Freq: Four times a day (QID) | RESPIRATORY_TRACT | 0 refills | Status: AC | PRN
Start: 2023-03-10 — End: ?

## 2023-03-10 MED ORDER — DOXYCYCLINE HYCLATE 100 MG PO CAPS
100.0000 mg | ORAL_CAPSULE | Freq: Two times a day (BID) | ORAL | 0 refills | Status: DC
Start: 2023-03-10 — End: 2023-12-26

## 2023-03-10 NOTE — ED Provider Notes (Signed)
[facility]    CSN: [id number] Arrival date & time: 03/10/23  0813      History   Chief Complaint Chief Complaint  Patient presents with   Cough    HPI Mercedes Lucas is a 68 y.o. female  presents for evaluation of URI symptoms for 3 weeks. Patient reports associated symptoms of productive cough, congestion, wheezing, shortness of breath, chest pain with coughing. Denies N/V/D, sore, ear pain, body aches, fever. Patient does have a hx of asthma.  Has a steroid inhaler but does not have an albuterol inhaler.  Patient is an active smoker.   Reports no sick contacts.  Pt has taken the next OTC for symptoms. Pt has no other concerns at this time.    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   Essential hypertension 12/27/2014   Hyperlipidemia 12/27/2014   Asthma, mild intermittent 12/27/2014   Hot flashes 12/27/2014   Environmental allergies 12/27/2014   Tobacco abuse 12/27/2014   Overweight (BMI 25.0-29.9) 06/02/2014   S/P left TKA 06/01/2014    Past Surgical History:  Procedure Laterality Date   ABDOMINAL HYSTERECTOMY  02/13/1987   ANKLE SURGERY  02/13/2003   APPENDECTOMY  02/12/1998   BLADDER SURGERY  02/13/2003   bladder stretch   bladder tact  02/13/1995   BREAST BIOPSY Right 10/12/2021   BREAST SURGERY Left 02/12/1985   biopsy   CHOLECYSTECTOMY  02/12/1998   COLONOSCOPY  02/12/2005   ELBOW SURGERY Left 02/12/2001   "damaged funny bone"   LAPAROSCOPY  02/12/1990   MENISCUS REPAIR  02/12/2010   TONSILLECTOMY  30's   TOTAL KNEE ARTHROPLASTY Left 06/01/2014    Procedure: LEFT TOTAL KNEE ARTHROPLASTY;  Surgeon: Durene Romans, MD;  Location: [facility];  Service: Orthopedics;  Laterality: Left;   TOTAL KNEE ARTHROPLASTY Right 01/24/2021   Procedure: TOTAL KNEE ARTHROPLASTY;  Surgeon: Durene Romans, MD;  Location: [facility];  Service: Orthopedics;  Laterality: Right;   TUBAL LIGATION  02/12/1985    OB History   No obstetric history on file.      Home Medications    Prior to Admission medications   Medication Sig Start Date End Date Taking? Authorizing Provider  albuterol (VENTOLIN HFA) 108 (90 Base) MCG/ACT inhaler Inhale 1-2 puffs into the lungs every 6 (six) hours as needed. 03/10/23  Yes Radford Pax, NP  amLODipine (NORVASC) 5 MG tablet TAKE 1 TABLET(5 MG) BY MOUTH DAILY 08/15/15   Ethelda Chick, MD  atorvastatin (LIPITOR) 40 MG tablet Take 1 tablet (40 mg total) by mouth daily. Patient taking differently: Take 40 mg by mouth at bedtime. 08/15/15   Ethelda Chick, MD  celecoxib (CELEBREX) 200 MG capsule Take 1 capsule (200 mg total) by mouth 2 (two) times daily. 01/25/21   Cassandria Anger, PA-C  docusate sodium (COLACE) 100 MG capsule Take 1 capsule (100 mg total) by mouth 2 (two) times daily. 01/25/21   Cassandria Anger, PA-C  doxycycline (VIBRAMYCIN) 100 MG capsule Take 1 capsule (100  mg total) by mouth 2 (two) times daily. 03/10/23  Yes Radford Pax, NP  escitalopram (LEXAPRO) 20 MG tablet Take 20 mg by mouth daily. Noon 10/25/20   [provider]  estradiol (ESTRACE) 2 MG tablet Take 2 mg by mouth daily.    [provider]  Fluticasone-Salmeterol (ADVAIR) 100-50 MCG/DOSE AEPB Inhale 1 puff into the lungs daily as needed (Shortness of breath). 03/10/23   Radford Pax, NP  hydrochlorothiazide (HYDRODIURIL) 12.5 MG tablet Take 12.5 mg by mouth daily.    [provider]  HYDROmorphone (DILAUDID) 2 MG tablet Take 1-2 tablets (2-4 mg total) by mouth every 6 (six) hours as needed for severe pain. 01/27/21   Cassandria Anger,  PA-C  hydrOXYzine (ATARAX) 25 MG tablet Take 1 tablet (25 mg total) by mouth 3 (three) times daily as needed for itching. 01/25/21   Cassandria Anger, PA-C  lisinopril (ZESTRIL) 40 MG tablet Take 40 mg by mouth daily.    [provider]  metFORMIN (GLUCOPHAGE) 500 MG tablet Take 500 mg by mouth daily with breakfast. 12/28/20   [provider]  methocarbamol (ROBAXIN) 500 MG tablet Take 1 tablet (500 mg total) by mouth every 6 (six) hours as needed for muscle spasms. 01/25/21   Cassandria Anger, PA-C  montelukast (SINGULAIR) 10 MG tablet Take 1 tablet (10 mg total) by mouth at bedtime. 08/15/15   Ethelda Chick, MD  polyethylene glycol (MIRALAX / GLYCOLAX) 17 g packet Take 17 g by mouth daily as needed for mild constipation. 01/25/21   Cassandria Anger, PA-C  predniSONE (DELTASONE) 20 MG tablet Take 2 tablets (40 mg total) by mouth daily with breakfast for 5 days. 03/10/23 03/15/23 Yes Radford Pax, NP  promethazine-dextromethorphan (PROMETHAZINE-DM) 6.25-15 MG/5ML syrup Take 5 mLs by mouth 4 (four) times daily as needed for cough. 03/10/23  Yes Radford Pax, NP    Family History Family History  Problem Relation Age of Onset   Cancer Mother 41       colon cancer   Hypertension Mother    Hyperlipidemia Mother    Cancer Father        prostate cancer, throat cancer   Heart disease Father 26       CABG/CAD; CHF    Social History Social History   Tobacco Use   Smoking status: Every Day    Current packs/day: 1.00    Average packs/day: 1 pack/day for 43.0 years (43.0 ttl pk-yrs)    Types: Cigarettes   Smokeless tobacco: Never  Vaping Use   Vaping status: Never Used  Substance Use Topics   Alcohol use: Yes    Comment: rare   Drug use: No     Allergies   Bee venom, Erythromycin, Codeine, and Adhesive [tape]   Review of Systems Review of Systems  HENT:  Positive for congestion.   Respiratory:  Positive for cough, shortness of breath and wheezing.       Physical Exam Triage Vital Signs ED Triage Vitals  Encounter Vitals Group     BP 03/10/23 0824 (!) 158/84     Systolic BP Percentile --      Diastolic BP Percentile --      Pulse Rate 03/10/23 0824 (!) 58     Resp 03/10/23 0829 18     Temp 03/10/23 0824 97.6 F (36.4 C)     Temp Source 03/10/23 0824 Oral     SpO2 03/10/23 0824 95 %  Weight --      Height --      Head Circumference --      Peak Flow --      Pain Score 03/10/23 0825 3     Pain Loc --      Pain Education --      Exclude from Growth Chart --    No data found.  Updated Vital Signs BP (!) 158/84 (BP Location: Right Arm)   Pulse (!) 58   Temp 97.6 F (36.4 C) (Oral)   Resp 18   SpO2 95%   Visual Acuity Right Eye Distance:   Left Eye Distance:   Bilateral Distance:    Right Eye Near:   Left Eye Near:    Bilateral Near:     Physical Exam Vitals and nursing note reviewed.  Constitutional:      General: She is not in acute distress.    Appearance: She is well-developed. She is not ill-appearing.  HENT:     Head: Normocephalic and atraumatic.     Right Ear: Tympanic membrane and ear canal normal.     Left Ear: Tympanic membrane and ear canal normal.     Nose: Congestion present.     Mouth/Throat:     Mouth: Mucous membranes are moist.     Pharynx: Oropharynx is clear. Uvula midline. No posterior oropharyngeal erythema.     Tonsils: No tonsillar exudate or tonsillar abscesses.  Eyes:     Conjunctiva/sclera: Conjunctivae normal.     Pupils: Pupils are equal, round, and reactive to light.  Cardiovascular:     Rate and Rhythm: Normal rate and regular rhythm.     Heart sounds: Normal heart sounds.  Pulmonary:     Effort: Pulmonary effort is normal.     Breath sounds: Wheezing present.     Comments: Faint expiratory wheeze bilateral bases Musculoskeletal:     Cervical back: Normal range of motion and neck supple.  Lymphadenopathy:     Cervical: No cervical adenopathy.  Skin:    General:  Skin is warm and dry.  Neurological:     General: No focal deficit present.     Mental Status: She is alert and oriented to person, place, and time.  Psychiatric:        Mood and Affect: Mood normal.        Behavior: Behavior normal.      UC Treatments / Results  Labs (all labs ordered are listed, but only abnormal results are displayed) Labs Reviewed - No data to display  EKG   Radiology No results found.  Procedures Procedures (including critical care time)  Medications Ordered in UC Medications  ipratropium-albuterol (DUONEB) 0.5-2.5 (3) MG/3ML nebulizer solution 3 mL (3 mLs Nebulization Given 03/10/23 0904)    Initial Impression / Assessment and Plan / UC Course  I have reviewed the triage vital signs and the nursing notes.  Pertinent labs & imaging results that were available during my care of the patient were reviewed by me and considered in my medical decision making (see chart for details).     Reviewed exam and symptoms with patient.  No red flags.  Wet read of x-ray without obvious consolidation, will contact for any positive results based on radiology overread.  Sent in albuterol inhaler and refilled her steroid inhaler.  Given length of symptoms we will do doxycycline twice daily.  Promethazine DM as needed for cough.  Prednisone daily for 5 days as well.  Lots of rest and  fluids.  PCP follow-up 2 to 3 days for recheck.  Strict ER precautions reviewed and patient verbalized understanding. Final Clinical Impressions(s) / UC Diagnoses   Final diagnoses:  Acute cough  Shortness of breath  Mild intermittent asthma with acute exacerbation  Acute bronchitis, unspecified organism     Discharge Instructions      Start doxycycline twice daily for 10 days.  You may take Promethazine DM as needed for cough.  Please note this medication will make you drowsy.  Do not drink alcohol or drive while on this medication.  I have sent you an albuterol inhaler to use as  needed for wheezing or shortness of breath and I have also refilled your steroid inhaler.  Start prednisone daily for 5 days.  Lots of rest and fluids.  Please follow-up with your PCP in 2 to 3 days for recheck.  Please go to the ER for any worsening symptoms.  I hope you feel better soon!     ED Prescriptions     Medication Sig Dispense Auth. Provider   albuterol (VENTOLIN HFA) 108 (90 Base) MCG/ACT inhaler Inhale 1-2 puffs into the lungs every 6 (six) hours as needed. 1 each Radford Pax, NP   Fluticasone-Salmeterol (ADVAIR) 100-50 MCG/DOSE AEPB Inhale 1 puff into the lungs daily as needed (Shortness of breath). 60 each Radford Pax, NP   predniSONE (DELTASONE) 20 MG tablet Take 2 tablets (40 mg total) by mouth daily with breakfast for 5 days. 10 tablet Radford Pax, NP   promethazine-dextromethorphan (PROMETHAZINE-DM) 6.25-15 MG/5ML syrup Take 5 mLs by mouth 4 (four) times daily as needed for cough. 118 mL Radford Pax, NP   doxycycline (VIBRAMYCIN) 100 MG capsule Take 1 capsule (100 mg total) by mouth 2 (two) times daily. 20 capsule Radford Pax, NP      PDMP not reviewed this encounter.   Radford Pax, NP 03/10/23 [phone]

## 2023-03-10 NOTE — Discharge Instructions (Signed)
Start doxycycline twice daily for 10 days.  You may take Promethazine DM as needed for cough.  Please note this medication will make you drowsy.  Do not drink alcohol or drive while on this medication.  I have sent you an albuterol inhaler to use as needed for wheezing or shortness of breath and I have also refilled your steroid inhaler.  Start prednisone daily for 5 days.  Lots of rest and fluids.  Please follow-up with your PCP in 2 to 3 days for recheck.  Please go to the ER for any worsening symptoms.  I hope you feel better soon!

## 2023-03-10 NOTE — ED Triage Notes (Signed)
Patient C/O productive cough, chest congestion, wheezing and rib pain. Patient has been using mucinex, tylenol and cough and cold with some relief. Patient used her inhaler 2 days ago.

## 2023-03-28 ENCOUNTER — Ambulatory Visit
Admission: RE | Admit: 2023-03-28 | Discharge: 2023-03-28 | Disposition: A | Discharge status: Home / Self Care | Payer: Medicare Other | Source: Ambulatory Visit | Attending: Family Medicine | Admitting: Family Medicine

## 2023-03-28 DIAGNOSIS — E2839 Other primary ovarian failure: Secondary | ICD-10-CM

## 2023-03-28 DIAGNOSIS — M8588 Other specified disorders of bone density and structure, other site: Secondary | ICD-10-CM | POA: Diagnosis not present

## 2023-04-29 ENCOUNTER — Other Ambulatory Visit: Payer: Medicare Other

## 2023-06-28 DIAGNOSIS — M25569 Pain in unspecified knee: Secondary | ICD-10-CM | POA: Diagnosis not present

## 2023-06-28 DIAGNOSIS — R7303 Prediabetes: Secondary | ICD-10-CM | POA: Diagnosis not present

## 2023-06-28 DIAGNOSIS — M79643 Pain in unspecified hand: Secondary | ICD-10-CM | POA: Diagnosis not present

## 2023-06-28 DIAGNOSIS — Z8639 Personal history of other endocrine, nutritional and metabolic disease: Secondary | ICD-10-CM | POA: Diagnosis not present

## 2023-06-28 DIAGNOSIS — I10 Essential (primary) hypertension: Secondary | ICD-10-CM | POA: Diagnosis not present

## 2023-06-28 DIAGNOSIS — R319 Hematuria, unspecified: Secondary | ICD-10-CM | POA: Diagnosis not present

## 2023-06-28 DIAGNOSIS — E213 Hyperparathyroidism, unspecified: Secondary | ICD-10-CM | POA: Diagnosis not present

## 2023-06-28 DIAGNOSIS — J4520 Mild intermittent asthma, uncomplicated: Secondary | ICD-10-CM | POA: Diagnosis not present

## 2023-06-28 DIAGNOSIS — E7800 Pure hypercholesterolemia, unspecified: Secondary | ICD-10-CM | POA: Diagnosis not present

## 2023-06-28 DIAGNOSIS — Z780 Asymptomatic menopausal state: Secondary | ICD-10-CM | POA: Diagnosis not present

## 2023-06-28 DIAGNOSIS — F17210 Nicotine dependence, cigarettes, uncomplicated: Secondary | ICD-10-CM | POA: Diagnosis not present

## 2023-10-01 ENCOUNTER — Other Ambulatory Visit: Payer: Self-pay | Admitting: Family Medicine

## 2023-10-01 DIAGNOSIS — Z1231 Encounter for screening mammogram for malignant neoplasm of breast: Secondary | ICD-10-CM

## 2023-10-31 ENCOUNTER — Ambulatory Visit
Admission: RE | Admit: 2023-10-31 | Discharge: 2023-10-31 | Disposition: A | Discharge status: Home / Self Care | Source: Ambulatory Visit | Attending: Family Medicine | Admitting: Family Medicine

## 2023-10-31 DIAGNOSIS — Z1231 Encounter for screening mammogram for malignant neoplasm of breast: Secondary | ICD-10-CM | POA: Diagnosis not present

## 2023-11-29 DIAGNOSIS — E1169 Type 2 diabetes mellitus with other specified complication: Secondary | ICD-10-CM | POA: Diagnosis not present

## 2023-11-29 DIAGNOSIS — J4520 Mild intermittent asthma, uncomplicated: Secondary | ICD-10-CM | POA: Diagnosis not present

## 2023-11-29 DIAGNOSIS — E213 Hyperparathyroidism, unspecified: Secondary | ICD-10-CM | POA: Diagnosis not present

## 2023-11-29 DIAGNOSIS — Z0000 Encounter for general adult medical examination without abnormal findings: Secondary | ICD-10-CM | POA: Diagnosis not present

## 2023-11-29 DIAGNOSIS — E7800 Pure hypercholesterolemia, unspecified: Secondary | ICD-10-CM | POA: Diagnosis not present

## 2023-11-29 DIAGNOSIS — Z23 Encounter for immunization: Secondary | ICD-10-CM | POA: Diagnosis not present

## 2023-11-29 DIAGNOSIS — M79643 Pain in unspecified hand: Secondary | ICD-10-CM | POA: Diagnosis not present

## 2023-11-29 DIAGNOSIS — I10 Essential (primary) hypertension: Secondary | ICD-10-CM | POA: Diagnosis not present

## 2023-11-29 DIAGNOSIS — M25569 Pain in unspecified knee: Secondary | ICD-10-CM | POA: Diagnosis not present

## 2023-12-16 ENCOUNTER — Ambulatory Visit
Admission: EM | Admit: 2023-12-16 | Discharge: 2023-12-16 | Disposition: A | Discharge status: Home / Self Care | Attending: Family Medicine | Admitting: Family Medicine

## 2023-12-16 ENCOUNTER — Ambulatory Visit (INDEPENDENT_AMBULATORY_CARE_PROVIDER_SITE_OTHER)

## 2023-12-16 DIAGNOSIS — J4541 Moderate persistent asthma with (acute) exacerbation: Secondary | ICD-10-CM | POA: Diagnosis not present

## 2023-12-16 DIAGNOSIS — B370 Candidal stomatitis: Secondary | ICD-10-CM

## 2023-12-16 DIAGNOSIS — R0602 Shortness of breath: Secondary | ICD-10-CM | POA: Diagnosis not present

## 2023-12-16 DIAGNOSIS — F17200 Nicotine dependence, unspecified, uncomplicated: Secondary | ICD-10-CM | POA: Diagnosis not present

## 2023-12-16 MED ORDER — IPRATROPIUM-ALBUTEROL 0.5-2.5 (3) MG/3ML IN SOLN
3.0000 mL | Freq: Once | RESPIRATORY_TRACT | Status: AC
  Administered 2023-12-16: 3 mL via RESPIRATORY_TRACT

## 2023-12-16 MED ORDER — IPRATROPIUM-ALBUTEROL 0.5-2.5 (3) MG/3ML IN SOLN: 3.0000 mL | RESPIRATORY_TRACT | 0 refills | Status: DC | PRN

## 2023-12-16 MED ORDER — PREDNISONE 50 MG PO TABS: 50.0000 mg | ORAL_TABLET | Freq: Every day | ORAL | 0 refills | Status: DC

## 2023-12-16 MED ORDER — FLUCONAZOLE 150 MG PO TABS
ORAL_TABLET | ORAL | 0 refills | Status: DC
Start: 2023-12-16 — End: 2023-12-26

## 2023-12-16 MED ORDER — METHYLPREDNISOLONE SODIUM SUCC 125 MG IJ SOLR
125.0000 mg | Freq: Once | INTRAMUSCULAR | Status: AC
  Administered 2023-12-16: 125 mg via INTRAMUSCULAR

## 2023-12-16 NOTE — ED Provider Notes (Signed)
 [facility]  Note:  This document was prepared using Conservation officer, historic buildings and may include unintentional dictation errors.  MRN: [id number] DOB: 03/12/1955  Subjective:   Mercedes Lucas is a 68 y.o. female presenting for 2-day history of acute onset wheezing, persistent hacking cough, shortness of breath, chest tightness.  Has longstanding history of asthma.  Has been using her albuterol  inhaler without relief.  Is also supposed to use Advair but is not sure if she is using this.  Does not have a nebulizer.  Patient smokes 1.5ppd.  No formal history of COPD.  No current facility-administered medications for this encounter.  Current Outpatient Medications:    albuterol  (VENTOLIN  HFA) 108 (90 Base) MCG/ACT inhaler, Inhale 1-2 puffs into the lungs every 6 (six) hours as needed., Disp: 1 each, Rfl: 0   amLODipine  (NORVASC ) 5 MG tablet, TAKE 1 TABLET(5 MG) BY MOUTH DAILY, Disp: 90 tablet, Rfl: 1   atorvastatin  (LIPITOR) 40 MG tablet, Take 1 tablet (40 mg total) by mouth daily. (Patient taking differently: Take 40 mg by mouth at bedtime.), Disp: 90 tablet, Rfl: 1   celecoxib  (CELEBREX ) 200 MG capsule, Take 1 capsule (200 mg total) by mouth 2 (two) times daily., Disp: 60 capsule, Rfl: 0   docusate sodium  (COLACE) 100 MG capsule, Take 1 capsule (100 mg total) by mouth 2 (two) times daily., Disp: 10 capsule, Rfl: 0   doxycycline  (VIBRAMYCIN ) 100 MG capsule, Take 1 capsule (100 mg total) by mouth 2 (two) times daily., Disp: 20 capsule, Rfl: 0   escitalopram  (LEXAPRO ) 20 MG tablet, Take 20 mg by mouth daily. Noon, Disp: , Rfl:    estradiol  (ESTRACE ) 2 MG tablet, Take 2 mg by mouth daily., Disp: , Rfl:    Fluticasone -Salmeterol (ADVAIR) 100-50 MCG/DOSE AEPB, Inhale 1 puff into the lungs daily as needed (Shortness of breath)., Disp: 60 each, Rfl: 1   hydrochlorothiazide  (HYDRODIURIL ) 12.5 MG tablet, Take 12.5 mg by mouth daily., Disp: , Rfl:    HYDROmorphone   (DILAUDID ) 2 MG tablet, Take 1-2 tablets (2-4 mg total) by mouth every 6 (six) hours as needed for severe pain., Disp: 42 tablet, Rfl: 0   hydrOXYzine  (ATARAX ) 25 MG tablet, Take 1 tablet (25 mg total) by mouth 3 (three) times daily as needed for itching., Disp: 30 tablet, Rfl: 0   lisinopril  (ZESTRIL ) 40 MG tablet, Take 40 mg by mouth daily., Disp: , Rfl:    metFORMIN  (GLUCOPHAGE ) 500 MG tablet, Take 500 mg by mouth daily with breakfast., Disp: , Rfl:    methocarbamol  (ROBAXIN ) 500 MG tablet, Take 1 tablet (500 mg total) by mouth every 6 (six) hours as needed for muscle spasms., Disp: 40 tablet, Rfl: 0   montelukast  (SINGULAIR ) 10 MG tablet, Take 1 tablet (10 mg total) by mouth at bedtime., Disp: 90 tablet, Rfl: 3   polyethylene glycol (MIRALAX  / GLYCOLAX ) 17 g packet, Take 17 g by mouth daily as needed for mild constipation., Disp: 14 each, Rfl: 0   promethazine -dextromethorphan (PROMETHAZINE -DM) 6.25-15 MG/5ML syrup, Take 5 mLs by mouth 4 (four) times daily as needed for cough., Disp: 118 mL, Rfl: 0   Allergies  Allergen Reactions   Bee Venom Anaphylaxis   Erythromycin Anaphylaxis and Nausea Only   Codeine Nausea Only   Tetracycline Hcl     Other Reaction(s): drunk feeling   Adhesive [Tape] Rash    From bandaids     Past Medical History:  Diagnosis Date   Allergy  Anemia    Arthritis    Asthma    mild-no attacks   Headache    migraines occasional   Hypercholesteremia    Hypertension    Pneumonia    PONV (postoperative nausea and vomiting)    Pre-diabetes      Past Surgical History:  Procedure Laterality Date   ABDOMINAL HYSTERECTOMY  02/13/1987   ANKLE SURGERY  02/13/2003   APPENDECTOMY  02/12/1998   BLADDER SURGERY  02/13/2003   bladder stretch   bladder tact  02/13/1995   BREAST BIOPSY Right 10/12/2021   BREAST SURGERY Left 02/12/1985   biopsy   CHOLECYSTECTOMY  02/12/1998   COLONOSCOPY  02/12/2005   ELBOW SURGERY Left 02/12/2001   damaged funny bone    LAPAROSCOPY  02/12/1990   MENISCUS REPAIR  02/12/2010   TONSILLECTOMY  30's   TOTAL KNEE ARTHROPLASTY Left 06/01/2014   Procedure: LEFT TOTAL KNEE ARTHROPLASTY;  Surgeon: Donnice Car, MD;  Location: [facility];  Service: Orthopedics;  Laterality: Left;   TOTAL KNEE ARTHROPLASTY Right 01/24/2021   Procedure: TOTAL KNEE ARTHROPLASTY;  Surgeon: Car Donnice, MD;  Location: [facility];  Service: Orthopedics;  Laterality: Right;   TUBAL LIGATION  02/12/1985    Family History  Problem Relation Age of Onset   Cancer Mother 25       colon cancer   Hypertension Mother    Hyperlipidemia Mother    Cancer Father        prostate cancer, throat cancer   Heart disease Father 16       CABG/CAD; CHF    Social History   Tobacco Use   Smoking status: Every Day    Current packs/day: 1.00    Average packs/day: 1 pack/day for 43.0 years (43.0 ttl pk-yrs)    Types: Cigarettes   Smokeless tobacco: Never  Vaping Use   Vaping status: Some Days  Substance Use Topics   Alcohol use: Yes    Comment: rare   Drug use: No    ROS   Objective:   Vitals: BP (!) 161/80 (BP Location: Left Arm)   Pulse 65   Temp 97.9 F (36.6 C) (Oral)   Resp 20   SpO2 90%   Physical Exam Constitutional:      General: She is not in acute distress.    Appearance: Normal appearance. She is well-developed. She is not ill-appearing, toxic-appearing or diaphoretic.  HENT:     Head: Normocephalic and atraumatic.     Nose: Nose normal.     Mouth/Throat:     Mouth: Mucous membranes are moist.  Eyes:     General: No scleral icterus.       Right eye: No discharge.        Left eye: No discharge.     Extraocular Movements: Extraocular movements intact.  Cardiovascular:     Rate and Rhythm: Normal rate and regular rhythm.     Heart sounds: Normal heart sounds. No murmur heard.    No friction rub. No gallop.  Pulmonary:     Effort: Pulmonary effort is normal. No respiratory distress.     Breath sounds: No stridor.  Wheezing and rhonchi present. No rales.  Chest:     Chest wall: No tenderness.  Skin:    General: Skin is warm and dry.  Neurological:     General: No focal deficit present.     Mental Status: She is alert and oriented to person, place, and time.  Psychiatric:  Mood and Affect: Mood normal.        Behavior: Behavior normal.     A 0.5mg -2.5mg  ipratropium-albuterol  nebulized solution was administered in clinic.  Wheezing improved, pulse oximetry remained between 90%-91%. A second 0.5mg -2.5mg  ipratropium-albuterol  nebulized solution was administered in clinic.  Breathing improved but pulse oximetry remained at 91%.  A dose of 125 mg Solu-Medrol was administered IM.  A nebulizer machine was dispensed to the patient.  DG Chest 2 View Result Date: 12/16/2023 EXAM: 2 VIEW(S) XRAY OF THE CHEST 12/16/2023 07:10:33 PM COMPARISON: Chest x-ray 03/10/23 CLINICAL HISTORY: Shortness of breath. FINDINGS: LUNGS AND PLEURA: No focal pulmonary opacity. No pulmonary edema. No pleural effusion. No pneumothorax. HEART AND MEDIASTINUM: No acute abnormality of the cardiac and mediastinal silhouettes. Atherosclerotic plaque. BONES AND SOFT TISSUES: No acute osseous abnormality. IMPRESSION: 1. No acute process. Electronically signed by: Morgane Naveau MD 12/16/2023 07:33 PM EST RP Workstation: [id number]     Assessment and Plan :   PDMP not reviewed this encounter.  1. Moderate persistent asthma with acute exacerbation   2. Shortness of breath   3. Tobacco use disorder    Patient refuses to go to the emergency room.  I had a discussion with patient about the risk of respiratory failure, respiratory distress.  Patient will monitor her pulse oximetry very closely at home and maintain strict ER precautions.  Start using nebulizer machine at home, supportive care.  At discharge, patient reports having irritation on her tongue, redness and burning sensation.  Recommended fluconazole  for oral thrush.  No history  of CKD.   Christopher Savannah, PA-C 12/16/23 1949

## 2023-12-16 NOTE — Discharge Instructions (Addendum)
 Monitor your pulse oxygen and go to the emergency room if it remains less than 90%. Schedule nebulized albuterol  treatments every 4 hours. Start prednisone  tomorrow.

## 2023-12-16 NOTE — ED Triage Notes (Signed)
 Pt reports wheezing, scough, feeling warm to the touch, hortness of breath x 2 days. Albuterol  and Advair gives no relief.

## 2023-12-25 ENCOUNTER — Inpatient Hospital Stay (HOSPITAL_COMMUNITY)
Admission: EM | Admit: 2023-12-25 | Discharge: 2023-12-30 | DRG: 189 | Disposition: A | Discharge status: Home / Self Care | Attending: Internal Medicine | Admitting: Internal Medicine

## 2023-12-25 ENCOUNTER — Ambulatory Visit: Payer: Self-pay

## 2023-12-25 ENCOUNTER — Encounter (HOSPITAL_COMMUNITY): Payer: Self-pay

## 2023-12-25 DIAGNOSIS — Z96653 Presence of artificial knee joint, bilateral: Secondary | ICD-10-CM | POA: Diagnosis present

## 2023-12-25 DIAGNOSIS — E1165 Type 2 diabetes mellitus with hyperglycemia: Secondary | ICD-10-CM | POA: Diagnosis present

## 2023-12-25 DIAGNOSIS — B370 Candidal stomatitis: Secondary | ICD-10-CM | POA: Diagnosis present

## 2023-12-25 DIAGNOSIS — Z7984 Long term (current) use of oral hypoglycemic drugs: Secondary | ICD-10-CM

## 2023-12-25 DIAGNOSIS — J4520 Mild intermittent asthma, uncomplicated: Secondary | ICD-10-CM

## 2023-12-25 DIAGNOSIS — Z7951 Long term (current) use of inhaled steroids: Secondary | ICD-10-CM

## 2023-12-25 DIAGNOSIS — Z9049 Acquired absence of other specified parts of digestive tract: Secondary | ICD-10-CM

## 2023-12-25 DIAGNOSIS — J9601 Acute respiratory failure with hypoxia: Secondary | ICD-10-CM | POA: Diagnosis not present

## 2023-12-25 DIAGNOSIS — Z885 Allergy status to narcotic agent status: Secondary | ICD-10-CM

## 2023-12-25 DIAGNOSIS — J8283 Eosinophilic asthma: Secondary | ICD-10-CM | POA: Diagnosis present

## 2023-12-25 DIAGNOSIS — T380X5A Adverse effect of glucocorticoids and synthetic analogues, initial encounter: Secondary | ICD-10-CM | POA: Diagnosis present

## 2023-12-25 DIAGNOSIS — J40 Bronchitis, not specified as acute or chronic: Secondary | ICD-10-CM

## 2023-12-25 DIAGNOSIS — Z716 Tobacco abuse counseling: Secondary | ICD-10-CM

## 2023-12-25 DIAGNOSIS — D72829 Elevated white blood cell count, unspecified: Secondary | ICD-10-CM | POA: Diagnosis present

## 2023-12-25 DIAGNOSIS — Z91048 Other nonmedicinal substance allergy status: Secondary | ICD-10-CM

## 2023-12-25 DIAGNOSIS — Z881 Allergy status to other antibiotic agents status: Secondary | ICD-10-CM

## 2023-12-25 DIAGNOSIS — Z1152 Encounter for screening for COVID-19: Secondary | ICD-10-CM

## 2023-12-25 DIAGNOSIS — Z888 Allergy status to other drugs, medicaments and biological substances status: Secondary | ICD-10-CM

## 2023-12-25 DIAGNOSIS — J209 Acute bronchitis, unspecified: Secondary | ICD-10-CM | POA: Diagnosis present

## 2023-12-25 DIAGNOSIS — E875 Hyperkalemia: Secondary | ICD-10-CM | POA: Diagnosis present

## 2023-12-25 DIAGNOSIS — J4541 Moderate persistent asthma with (acute) exacerbation: Secondary | ICD-10-CM | POA: Diagnosis present

## 2023-12-25 DIAGNOSIS — Z79899 Other long term (current) drug therapy: Secondary | ICD-10-CM

## 2023-12-25 DIAGNOSIS — Z90710 Acquired absence of both cervix and uterus: Secondary | ICD-10-CM

## 2023-12-25 DIAGNOSIS — F17210 Nicotine dependence, cigarettes, uncomplicated: Secondary | ICD-10-CM | POA: Diagnosis present

## 2023-12-25 DIAGNOSIS — Z8249 Family history of ischemic heart disease and other diseases of the circulatory system: Secondary | ICD-10-CM

## 2023-12-25 DIAGNOSIS — E7800 Pure hypercholesterolemia, unspecified: Secondary | ICD-10-CM | POA: Diagnosis present

## 2023-12-25 DIAGNOSIS — I10 Essential (primary) hypertension: Secondary | ICD-10-CM | POA: Diagnosis present

## 2023-12-25 DIAGNOSIS — Z91030 Bee allergy status: Secondary | ICD-10-CM

## 2023-12-25 NOTE — ED Triage Notes (Addendum)
 Pt coming from home having difficulty breathing earlier today. Pt took 4 of her own albut treatments at home with no relief. Expiratory wheezing heard. Congested non productive cough. Pt got RSV shot several wks ago have had issues since then.  EMS 164/82BP  96HR 22RR 80S on RA, 98% on neb treatment

## 2023-12-25 NOTE — Telephone Encounter (Signed)
 FYI Only or Action Required?: FYI only for provider: appointment scheduled on 11.13.25.  Patient is followed in Pulmonology for n/a, last seen on n/a.  Called Nurse Triage reporting Shortness of Breath.  Symptoms began several weeks ago.  Interventions attempted: Prescription medications: albuterol , duoneb,maintenance .  Symptoms are: gradually worsening.  Triage Disposition: See HCP Within 4 Hours (Or PCP Triage)  Patient/caregiver understands and will follow disposition?: Yes   Copied from CRM [id number]. Topic: Clinical - Red Word Triage >> Dec 25, 2023  4:42 PM Rilla B wrote: [facility] that prompted transfer to Nurse Triage:  Low oxygen (85%) shortness of breath, wheezing, congestion   ----------------------------------------------------------------------- From previous Reason for Contact - Scheduling: Patient/patient representative is calling to schedule an appointment. Refer to attachments for appointment information. Reason for Disposition  [1] Longstanding difficulty breathing (e.g., CHF, COPD, emphysema) AND [2] WORSE than normal  Answer Assessment - Initial Assessment Questions Pt was recently in the ER last week for this. Her PCP office has not returned calls or message online. Husband states she needs more help. He states this morning she was 92% on room air. Then prior to him getting home, she was 85%. As Rn was talking to husband, patient was doing a breathing treatment and she was 93%. She does not wear oxygen. She has a cough with congestion that gets worse at night when she lays down. Rn did give strict instructions on when to return to the ER. Also educated on inhaler vs nebulizer and at this time to use nebulizer more throughout the day so she doesn't get into distress. Pt was able to speak in full sentences after the nebulizer treatment. RN transferred to PAS to assist with scheduling new pulm patient.      1. RESPIRATORY STATUS: Describe your breathing? (e.g.,  wheezing, shortness of breath, unable to speak, severe coughing)      Shortness of breath, wheezing at times 2. ONSET: When did this breathing problem begin?      2 weeks ago 3. PATTERN Does the difficult breathing come and go, or has it been constant since it started?      Comes and goes 4. SEVERITY: How bad is your breathing? (e.g., mild, moderate, severe)      Moderate to severe at times 5. RECURRENT SYMPTOM: Have you had difficulty breathing before? If Yes, ask: When was the last time? and What happened that time?      yes  7. LUNG HISTORY: Do you have any history of lung disease?  (e.g., pulmonary embolus, asthma, emphysema)     asthma 8. CAUSE: What do you think is causing the breathing problem?      Cough, congestion 9. OTHER SYMPTOMS: Do you have any other symptoms? (e.g., chest pain, cough, dizziness, fever, runny nose)     Wheezing, cough,  10. O2 SATURATION MONITOR:  Do you use an oxygen saturation monitor (pulse oximeter) at home? If Yes, ask: What is your reading (oxygen level) today? What is your usual oxygen saturation reading? (e.g., 95%)       92 this am, 85 during the day, 93 currently  Protocols used: Breathing Difficulty-A-AH

## 2023-12-26 ENCOUNTER — Ambulatory Visit: Admitting: Pulmonary Disease

## 2023-12-26 ENCOUNTER — Other Ambulatory Visit: Payer: Self-pay

## 2023-12-26 ENCOUNTER — Emergency Department (HOSPITAL_COMMUNITY)

## 2023-12-26 DIAGNOSIS — J9601 Acute respiratory failure with hypoxia: Secondary | ICD-10-CM | POA: Diagnosis present

## 2023-12-26 DIAGNOSIS — J209 Acute bronchitis, unspecified: Secondary | ICD-10-CM | POA: Diagnosis present

## 2023-12-26 DIAGNOSIS — Z8249 Family history of ischemic heart disease and other diseases of the circulatory system: Secondary | ICD-10-CM | POA: Diagnosis not present

## 2023-12-26 DIAGNOSIS — E7800 Pure hypercholesterolemia, unspecified: Secondary | ICD-10-CM | POA: Diagnosis present

## 2023-12-26 DIAGNOSIS — Z1152 Encounter for screening for COVID-19: Secondary | ICD-10-CM | POA: Diagnosis not present

## 2023-12-26 DIAGNOSIS — Z91048 Other nonmedicinal substance allergy status: Secondary | ICD-10-CM | POA: Diagnosis not present

## 2023-12-26 DIAGNOSIS — Z96653 Presence of artificial knee joint, bilateral: Secondary | ICD-10-CM | POA: Diagnosis present

## 2023-12-26 DIAGNOSIS — Z885 Allergy status to narcotic agent status: Secondary | ICD-10-CM | POA: Diagnosis not present

## 2023-12-26 DIAGNOSIS — Z79899 Other long term (current) drug therapy: Secondary | ICD-10-CM | POA: Diagnosis not present

## 2023-12-26 DIAGNOSIS — E875 Hyperkalemia: Secondary | ICD-10-CM | POA: Diagnosis present

## 2023-12-26 DIAGNOSIS — Z7984 Long term (current) use of oral hypoglycemic drugs: Secondary | ICD-10-CM | POA: Diagnosis not present

## 2023-12-26 DIAGNOSIS — J40 Bronchitis, not specified as acute or chronic: Secondary | ICD-10-CM | POA: Diagnosis present

## 2023-12-26 DIAGNOSIS — I10 Essential (primary) hypertension: Secondary | ICD-10-CM | POA: Diagnosis present

## 2023-12-26 DIAGNOSIS — Z91030 Bee allergy status: Secondary | ICD-10-CM | POA: Diagnosis not present

## 2023-12-26 DIAGNOSIS — Z90710 Acquired absence of both cervix and uterus: Secondary | ICD-10-CM | POA: Diagnosis not present

## 2023-12-26 DIAGNOSIS — Z881 Allergy status to other antibiotic agents status: Secondary | ICD-10-CM | POA: Diagnosis not present

## 2023-12-26 DIAGNOSIS — J4541 Moderate persistent asthma with (acute) exacerbation: Secondary | ICD-10-CM | POA: Diagnosis present

## 2023-12-26 DIAGNOSIS — F17210 Nicotine dependence, cigarettes, uncomplicated: Secondary | ICD-10-CM | POA: Diagnosis present

## 2023-12-26 DIAGNOSIS — E1165 Type 2 diabetes mellitus with hyperglycemia: Secondary | ICD-10-CM | POA: Diagnosis present

## 2023-12-26 DIAGNOSIS — B370 Candidal stomatitis: Secondary | ICD-10-CM | POA: Diagnosis present

## 2023-12-26 DIAGNOSIS — Z716 Tobacco abuse counseling: Secondary | ICD-10-CM | POA: Diagnosis not present

## 2023-12-26 DIAGNOSIS — Z7951 Long term (current) use of inhaled steroids: Secondary | ICD-10-CM | POA: Diagnosis not present

## 2023-12-26 DIAGNOSIS — J8283 Eosinophilic asthma: Secondary | ICD-10-CM | POA: Diagnosis present

## 2023-12-26 DIAGNOSIS — D72829 Elevated white blood cell count, unspecified: Secondary | ICD-10-CM | POA: Diagnosis present

## 2023-12-26 DIAGNOSIS — Z888 Allergy status to other drugs, medicaments and biological substances status: Secondary | ICD-10-CM | POA: Diagnosis not present

## 2023-12-26 LAB — COMPREHENSIVE METABOLIC PANEL WITH GFR
ALT: 46 U/L — ABNORMAL HIGH (ref 0–44)
AST: 24 U/L (ref 15–41)
Albumin: 3.5 g/dL (ref 3.5–5.0)
Alkaline Phosphatase: 64 U/L (ref 38–126)
Anion gap: 12 (ref 5–15)
BUN: 23 mg/dL (ref 8–23)
CO2: 20 mmol/L — ABNORMAL LOW (ref 22–32)
Calcium: 10.2 mg/dL (ref 8.9–10.3)
Chloride: 103 mmol/L (ref 98–111)
Creatinine, Ser: 1.17 mg/dL — ABNORMAL HIGH (ref 0.44–1.00)
GFR, Estimated: 50 mL/min — ABNORMAL LOW (ref >60)
Glucose, Bld: 149 mg/dL — ABNORMAL HIGH (ref 70–99)
Potassium: 3.7 mmol/L (ref 3.5–5.1)
Sodium: 135 mmol/L (ref 135–145)
Total Bilirubin: 0.7 mg/dL (ref 0.0–1.2)
Total Protein: 6.4 g/dL — ABNORMAL LOW (ref 6.5–8.1)

## 2023-12-26 LAB — I-STAT VENOUS BLOOD GAS, ED
Acid-Base Excess: 0 mmol/L (ref 0.0–2.0)
Bicarbonate: 23.4 mmol/L (ref 20.0–28.0)
Calcium, Ion: 1.26 mmol/L (ref 1.15–1.40)
HCT: 42 % (ref 36.0–46.0)
Hemoglobin: 14.3 g/dL (ref 12.0–15.0)
O2 Saturation: 100 %
Potassium: 3.7 mmol/L (ref 3.5–5.1)
Sodium: 136 mmol/L (ref 135–145)
TCO2: 24 mmol/L (ref 22–32)
pCO2, Ven: 33.9 mmHg — ABNORMAL LOW (ref 44–60)
pH, Ven: 7.447 — ABNORMAL HIGH (ref 7.25–7.43)
pO2, Ven: 187 mmHg — ABNORMAL HIGH (ref 32–45)

## 2023-12-26 LAB — CBC WITH DIFFERENTIAL/PLATELET
Abs Immature Granulocytes: 0.4 K/uL — ABNORMAL HIGH (ref 0.00–0.07)
Basophils Absolute: 0.6 K/uL — ABNORMAL HIGH (ref 0.0–0.1)
Basophils Relative: 2 %
Eosinophils Absolute: 2.5 K/uL — ABNORMAL HIGH (ref 0.0–0.5)
Eosinophils Relative: 9 %
HCT: 43.3 % (ref 36.0–46.0)
Hemoglobin: 13.8 g/dL (ref 12.0–15.0)
Immature Granulocytes: 1 %
Lymphocytes Relative: 21 %
Lymphs Abs: 5.8 K/uL — ABNORMAL HIGH (ref 0.7–4.0)
MCH: 28.2 pg (ref 26.0–34.0)
MCHC: 31.9 g/dL (ref 30.0–36.0)
MCV: 88.5 fL (ref 80.0–100.0)
Monocytes Absolute: 0.8 K/uL (ref 0.1–1.0)
Monocytes Relative: 3 %
Neutro Abs: 18.1 K/uL — ABNORMAL HIGH (ref 1.7–7.7)
Neutrophils Relative %: 65 %
Platelets: 255 K/uL (ref 150–400)
RBC: 4.89 MIL/uL (ref 3.87–5.11)
RDW: 14.3 % (ref 11.5–15.5)
Smear Review: NORMAL
WBC: 27.8 K/uL — ABNORMAL HIGH (ref 4.0–10.5)
nRBC: 0 % (ref 0.0–0.2)
nRBC: 1 /100{WBCs} — ABNORMAL HIGH

## 2023-12-26 LAB — HIV ANTIBODY (ROUTINE TESTING W REFLEX): HIV Screen 4th Generation wRfx: NONREACTIVE

## 2023-12-26 LAB — RESP PANEL BY RT-PCR (RSV, FLU A&B, COVID)  RVPGX2
Influenza A by PCR: NEGATIVE
Influenza B by PCR: NEGATIVE
Resp Syncytial Virus by PCR: NEGATIVE
SARS Coronavirus 2 by RT PCR: NEGATIVE

## 2023-12-26 LAB — TROPONIN I (HIGH SENSITIVITY)
Troponin I (High Sensitivity): 9 ng/L (ref <18)
Troponin I (High Sensitivity): 8 ng/L (ref <18)

## 2023-12-26 LAB — D-DIMER, QUANTITATIVE: D-Dimer, Quant: 0.38 ug{FEU}/mL (ref 0.00–0.50)

## 2023-12-26 LAB — BRAIN NATRIURETIC PEPTIDE: B Natriuretic Peptide: 32.4 pg/mL (ref 0.0–100.0)

## 2023-12-26 MED ORDER — METHYLPREDNISOLONE SODIUM SUCC 40 MG IJ SOLR
40.0000 mg | Freq: Every day | INTRAMUSCULAR | Status: DC
  Administered 2023-12-26: 40 mg via INTRAVENOUS
  Filled 2023-12-26: qty 1

## 2023-12-26 MED ORDER — ATORVASTATIN CALCIUM 40 MG PO TABS
40.0000 mg | ORAL_TABLET | Freq: Every day | ORAL | Status: DC
  Administered 2023-12-26 – 2023-12-29 (×4): 40 mg via ORAL
  Filled 2023-12-26 (×4): qty 1

## 2023-12-26 MED ORDER — MELATONIN 5 MG PO TABS
5.0000 mg | ORAL_TABLET | Freq: Every evening | ORAL | Status: DC | PRN
  Administered 2023-12-26 – 2023-12-29 (×4): 5 mg via ORAL
  Filled 2023-12-26 (×4): qty 1

## 2023-12-26 MED ORDER — LEVOFLOXACIN IN D5W 500 MG/100ML IV SOLN
500.0000 mg | Freq: Once | INTRAVENOUS | Status: AC
  Administered 2023-12-26: 500 mg via INTRAVENOUS
  Filled 2023-12-26: qty 100

## 2023-12-26 MED ORDER — PROCHLORPERAZINE EDISYLATE 10 MG/2ML IJ SOLN: 5.0000 mg | Freq: Four times a day (QID) | INTRAMUSCULAR | Status: DC | PRN

## 2023-12-26 MED ORDER — DIPHENHYDRAMINE-ZINC ACETATE 2-0.1 % EX CREA
TOPICAL_CREAM | Freq: Two times a day (BID) | CUTANEOUS | Status: DC | PRN
  Filled 2023-12-26 (×3): qty 28

## 2023-12-26 MED ORDER — GUAIFENESIN-DM 100-10 MG/5ML PO SYRP
5.0000 mL | ORAL_SOLUTION | ORAL | Status: DC | PRN
  Administered 2023-12-26 – 2023-12-29 (×2): 5 mL via ORAL
  Filled 2023-12-26 (×2): qty 5

## 2023-12-26 MED ORDER — ENOXAPARIN SODIUM 40 MG/0.4ML IJ SOSY
40.0000 mg | PREFILLED_SYRINGE | INTRAMUSCULAR | Status: DC
  Administered 2023-12-26 – 2023-12-30 (×5): 40 mg via SUBCUTANEOUS
  Filled 2023-12-26 (×5): qty 0.4

## 2023-12-26 MED ORDER — IPRATROPIUM-ALBUTEROL 0.5-2.5 (3) MG/3ML IN SOLN
3.0000 mL | Freq: Four times a day (QID) | RESPIRATORY_TRACT | Status: DC
  Administered 2023-12-26 – 2023-12-28 (×9): 3 mL via RESPIRATORY_TRACT
  Filled 2023-12-26 (×9): qty 3

## 2023-12-26 MED ORDER — ACETAMINOPHEN 500 MG PO TABS
500.0000 mg | ORAL_TABLET | Freq: Four times a day (QID) | ORAL | Status: AC | PRN
  Administered 2023-12-26 – 2023-12-28 (×4): 500 mg via ORAL
  Filled 2023-12-26 (×4): qty 1

## 2023-12-26 MED ORDER — AMLODIPINE BESYLATE 5 MG PO TABS
5.0000 mg | ORAL_TABLET | Freq: Every day | ORAL | Status: DC
  Administered 2023-12-26 – 2023-12-30 (×5): 5 mg via ORAL
  Filled 2023-12-26 (×5): qty 1

## 2023-12-26 MED ORDER — LEVOFLOXACIN IN D5W 500 MG/100ML IV SOLN
500.0000 mg | INTRAVENOUS | Status: DC
  Administered 2023-12-26: 500 mg via INTRAVENOUS
  Filled 2023-12-26 (×3): qty 100

## 2023-12-26 MED ORDER — ALBUTEROL SULFATE (2.5 MG/3ML) 0.083% IN NEBU
10.0000 mg | INHALATION_SOLUTION | RESPIRATORY_TRACT | Status: DC
  Administered 2023-12-26: 10 mg via RESPIRATORY_TRACT
  Filled 2023-12-26: qty 12

## 2023-12-26 MED ORDER — POLYETHYLENE GLYCOL 3350 17 G PO PACK: 17.0000 g | PACK | Freq: Every day | ORAL | Status: DC | PRN
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
## 2023-12-26 NOTE — ED Provider Notes (Signed)
 [place] [facility] Provider Note   CSN: [id number] Arrival date & time: 12/25/23  2338     Patient presents with: Shortness of Breath   Mercedes Lucas is a 68 y.o. female.   presenting with difficulty breathing and wheezing, which began approximately two weeks ago following an RSV vaccination received on October 17th. She reports intermittent symptoms, including episodes of wheezing and dyspnea, with occasional low-grade fevers. The patient has a history of smoking, currently smoking about a pack every two days, and has smoked for less than twenty years in total. She denies any history of COPD or cardiac issues. She has been experiencing oral thrush, likely secondary to recent steroid use, which has affected her taste. The patient has been using her home albuterol  inhaler without significant relief and received additional treatments from EMS, including albuterol  and other medications, with partial improvement. She reports her oxygen saturation levels have been in the 80s over the past few days. There is no associated leg swelling or pain. The history was obtained from the patient.   Shortness of Breath      Prior to Admission medications   Medication Sig Start Date End Date Taking? Authorizing Provider  acetaminophen  (TYLENOL ) 500 MG tablet Take 500 mg by mouth every 6 (six) hours as needed for mild pain (pain score 1-3) or moderate pain (pain score 4-6).   Yes [provider]  albuterol  (VENTOLIN  HFA) 108 (90 Base) MCG/ACT inhaler Inhale 1-2 puffs into the lungs every 6 (six) hours as needed. Patient taking differently: Inhale 1-2 puffs into the lungs every 6 (six) hours as needed for wheezing or shortness of breath. 03/10/23  Yes Mayer, Jodi R, NP  amLODipine  (NORVASC ) 5 MG tablet TAKE 1 TABLET(5 MG) BY MOUTH DAILY Patient taking differently: Take 5 mg by mouth daily at 12 noon. 08/15/15  Yes Claudene Rayfield HERO, MD  atorvastatin  (LIPITOR) 40 MG tablet Take 1  tablet (40 mg total) by mouth daily. Patient taking differently: Take 40 mg by mouth at bedtime. 08/15/15  Yes Smith, Kristi M, MD  celecoxib  (CELEBREX ) 200 MG capsule Take 1 capsule (200 mg total) by mouth 2 (two) times daily. Patient taking differently: Take 200 mg by mouth at bedtime. 01/25/21  Yes Patti Rosina SAUNDERS, PA-C  escitalopram  (LEXAPRO ) 20 MG tablet Take 20 mg by mouth daily at 12 noon. 10/25/20  Yes [provider]  estradiol  (ESTRACE ) 2 MG tablet Take 2 mg by mouth daily.   Yes [provider]  Fluticasone -Salmeterol (ADVAIR) 100-50 MCG/DOSE AEPB Inhale 1 puff into the lungs daily as needed (Shortness of breath). 03/10/23  Yes Mayer, Jodi R, NP  hydrochlorothiazide  (HYDRODIURIL ) 12.5 MG tablet Take 12.5 mg by mouth daily.   Yes [provider]  ipratropium-albuterol  (DUONEB) 0.5-2.5 (3) MG/3ML SOLN Take 3 mLs by nebulization every 4 (four) hours as needed. 12/16/23  Yes Christopher Savannah, PA-C  lisinopril  (ZESTRIL ) 40 MG tablet Take 40 mg by mouth daily.   Yes [provider]  metFORMIN  (GLUCOPHAGE ) 500 MG tablet Take 500 mg by mouth daily with breakfast. 12/28/20  Yes [provider]  montelukast  (SINGULAIR ) 10 MG tablet Take 1 tablet (10 mg total) by mouth at bedtime. 08/15/15  Yes Claudene Rayfield HERO, MD  Multiple Vitamins-Minerals (CENTRUM SILVER ULTRA WOMENS PO) Take 0.5 tablets by mouth daily at 12 noon.   Yes [provider]  gabapentin (NEURONTIN) 300 MG capsule Take 300 mg by mouth at bedtime. Patient not taking: Reported on  12/26/2023 11/29/23   [provider]    Allergies: Bee venom, Erythromycin, Codeine, Tetracycline hcl, and Adhesive [tape]    Review of Systems  Respiratory:  Positive for shortness of breath.     Updated Vital Signs BP (!) 121/52 (BP Location: Left Arm)   Pulse 82   Temp 97.8 F (36.6 C) (Oral)   Resp 14   Ht 5' 5 (1.651 m)   Wt 78.4 kg   SpO2 93%   BMI 28.76 kg/m   Physical Exam Vitals  and nursing note reviewed.  Constitutional:      Appearance: She is well-developed.  HENT:     Head: Normocephalic and atraumatic.  Cardiovascular:     Rate and Rhythm: Normal rate and regular rhythm.  Pulmonary:     Effort: No respiratory distress.     Breath sounds: No stridor. Decreased breath sounds and wheezing present.  Abdominal:     General: There is no distension.  Musculoskeletal:     Cervical back: Normal range of motion.     Right lower leg: No edema.     Left lower leg: No edema.  Neurological:     Mental Status: She is alert.     (all labs ordered are listed, but only abnormal results are displayed) Labs Reviewed  CBC WITH DIFFERENTIAL/PLATELET - Abnormal; Notable for the following components:      Result Value   WBC 27.8 (*)    Neutro Abs 18.1 (*)    Lymphs Abs 5.8 (*)    Eosinophils Absolute 2.5 (*)    Basophils Absolute 0.6 (*)    nRBC 1 (*)    Abs Immature Granulocytes 0.40 (*)    All other components within normal limits  COMPREHENSIVE METABOLIC PANEL WITH GFR - Abnormal; Notable for the following components:   CO2 20 (*)    Glucose, Bld 149 (*)    Creatinine, Ser 1.17 (*)    Total Protein 6.4 (*)    ALT 46 (*)    GFR, Estimated 50 (*)    All other components within normal limits  I-STAT VENOUS BLOOD GAS, ED - Abnormal; Notable for the following components:   pH, Ven 7.447 (*)    pCO2, Ven 33.9 (*)    pO2, Ven 187 (*)    All other components within normal limits  RESP PANEL BY RT-PCR (RSV, FLU A&B, COVID)  RVPGX2  D-DIMER, QUANTITATIVE  BRAIN NATRIURETIC PEPTIDE  HIV ANTIBODY (ROUTINE TESTING W REFLEX)  CBC  BASIC METABOLIC PANEL WITH GFR  TROPONIN I (HIGH SENSITIVITY)  TROPONIN I (HIGH SENSITIVITY)    EKG: None  Radiology: DG Chest Portable 1 View Result Date: 12/26/2023 EXAM: 1 VIEW(S) XRAY OF THE CHEST 12/26/2023 12:48:43 AM COMPARISON: Comparison with 12/16/2023. CLINICAL HISTORY: eval for cough/wheezing FINDINGS: LUNGS AND PLEURA:  No focal pulmonary opacity. No pleural effusion. No pneumothorax. HEART AND MEDIASTINUM: No acute abnormality of the cardiac and mediastinal silhouettes. BONES AND SOFT TISSUES: No acute osseous abnormality. IMPRESSION: 1. No acute cardiopulmonary process. Electronically signed by: Norman Gatlin MD 12/26/2023 12:55 AM EST RP Workstation: [id number]     .Critical Care  Performed by: Lorette Mayo, MD Authorized by: Lorette Mayo, MD   Critical care provider statement:    Critical care time (minutes):  30   Critical care was necessary to treat or prevent imminent or life-threatening deterioration of the following conditions:  Respiratory failure   Critical care was time spent personally by me on the following activities:  Development of  treatment plan with patient or surrogate, discussions with consultants, evaluation of patient's response to treatment, examination of patient, ordering and review of laboratory studies, ordering and review of radiographic studies, ordering and performing treatments and interventions, pulse oximetry, re-evaluation of patient's condition and review of old charts    Medications Ordered in the ED  albuterol  (PROVENTIL ) (2.5 MG/3ML) 0.083% nebulizer solution 10 mg (10 mg Nebulization New Bag/Given 12/26/23 0156)  enoxaparin (LOVENOX) injection 40 mg (40 mg Subcutaneous Given 12/26/23 0935)  levofloxacin (LEVAQUIN) IVPB 500 mg (500 mg Intravenous New Bag/Given 12/26/23 2204)  acetaminophen  (TYLENOL ) tablet 500 mg (500 mg Oral Given 12/26/23 1753)  prochlorperazine (COMPAZINE) injection 5 mg (has no administration in time range)  melatonin tablet 5 mg (has no administration in time range)  polyethylene glycol (MIRALAX  / GLYCOLAX ) packet 17 g (has no administration in time range)  methylPREDNISolone sodium succinate (SOLU-MEDROL) 40 mg/mL injection 40 mg (40 mg Intravenous Given 12/26/23 0626)  ipratropium-albuterol  (DUONEB) 0.5-2.5 (3) MG/3ML nebulizer solution 3 mL (3  mLs Nebulization Given 12/26/23 2054)  guaiFENesin-dextromethorphan (ROBITUSSIN DM) 100-10 MG/5ML syrup 5 mL (5 mLs Oral Given 12/26/23 1011)  atorvastatin  (LIPITOR) tablet 40 mg (40 mg Oral Given 12/26/23 2148)  amLODipine  (NORVASC ) tablet 5 mg (5 mg Oral Given 12/26/23 0935)  diphenhydrAMINE -zinc acetate (BENADRYL ) 2-0.1 % cream ( Topical Given 12/26/23 2251)  levofloxacin (LEVAQUIN) IVPB 500 mg (0 mg Intravenous Stopped 12/26/23 0354)                                    Medical Decision Making Amount and/or Complexity of Data Reviewed Labs: ordered. Radiology: ordered. ECG/medicine tests: ordered.  Risk Prescription drug management. Decision regarding hospitalization.   Leukocytosis, tachypnea, hypoxia after 7 breathing treatments between EMS/home/here. Already ahd mag and solumedrol with EMS. Observed for a couple hours, d dimer negative, trop/ecg negative, xr ok. Likely undiagnosed COPD vs bronchitis vs CAP, abx started. On 2L for WOB and hypoxia, will admit.    Final diagnoses:  Acute respiratory failure with hypoxia [facility])  Bronchitis    ED Discharge Orders     None          Mackensey Bolte, Selinda, MD 12/26/23 2256

## 2023-12-26 NOTE — H&P (Addendum)
 History and Physical  September  LITTIE Lager FMW:[id number] DOB: 1956-01-21 DOA: 12/25/2023  Referring physician: Dr. Lorette, EDP  PCP: [name], PA  Outpatient Specialists: Endocrinology. Patient coming from: Home.  Chief Complaint: Shortness of breath, worsening productive cough  HPI: Mercedes Lucas is a 68 y.o. female with medical history significant for essential hypertension, hyperlipidemia, current tobacco user, denies formal diagnosis of asthma or COPD, who presents to the ER due to worsening productive cough and shortness of breath for the past few days.  Her dyspnea is worse with exertion.  Initially went to urgent care on 12/16/2023 and was diagnosed with moderate persistent asthma with acute exacerbation.  She was advised to closely monitor her oxygen level at home and go to the ER if her oxygen level dropped.  Endorses hypoxemia at home with O2 saturation in the 80s on room air.  EMS was activated.  She received DuoNebs, IV Solu-Medrol, and IV magnesium  en route.    In the ER, tachycardic and tachypneic.  Hypoxic with O2 saturation of 88% on room air after several breathing treatments.  Chest x-ray was nonacute.  Due to concern for possible asthma exacerbation, bronchitis with bacterial infection, the patient was started on IV Levaquin.  Admitted by [facility], hospitalist service.  ED Course: Temperature 97.5.  BP 136/90, pulse 106, respiratory rate 23, O2 saturation 96% on 2 L.  Review of Systems: Review of systems as noted in the HPI. All other systems reviewed and are negative.   Past Medical History:  Diagnosis Date   Allergy    Anemia    Arthritis    Asthma    mild-no attacks   Headache    migraines occasional   Hypercholesteremia    Hypertension    Pneumonia    PONV (postoperative nausea and vomiting)    Pre-diabetes    Past Surgical History:  Procedure Laterality Date   ABDOMINAL HYSTERECTOMY  02/13/1987   ANKLE SURGERY  02/13/2003   APPENDECTOMY  02/12/1998    BLADDER SURGERY  02/13/2003   bladder stretch   bladder tact  02/13/1995   BREAST BIOPSY Right 10/12/2021   BREAST SURGERY Left 02/12/1985   biopsy   CHOLECYSTECTOMY  02/12/1998   COLONOSCOPY  02/12/2005   ELBOW SURGERY Left 02/12/2001   damaged funny bone   LAPAROSCOPY  02/12/1990   MENISCUS REPAIR  02/12/2010   TONSILLECTOMY  30's   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 She has a 43 pack-year smoking history. She has never used smokeless tobacco. She reports current alcohol use. She reports that she does not use drugs.   Allergies  Allergen Reactions   Bee Venom Anaphylaxis   Erythromycin Anaphylaxis and Nausea Only   Codeine Nausea Only   Tetracycline Hcl     Other Reaction(s): drunk feeling   Adhesive [Tape] Rash    From bandaids     Family History  Problem Relation Age of Onset   Cancer Mother 32       colon cancer   Hypertension Mother    Hyperlipidemia Mother    Cancer Father  prostate cancer, throat cancer   Heart disease Father 11       CABG/CAD; CHF      Prior to Admission medications   Medication Sig Start Date End Date Taking? Authorizing Provider  albuterol  (VENTOLIN  HFA) 108 (90 Base) MCG/ACT inhaler Inhale 1-2 puffs into the lungs every 6 (six) hours as needed. 03/10/23   Loreda Myla SAUNDERS, NP  amLODipine  (NORVASC ) 5 MG tablet TAKE 1 TABLET(5 MG) BY MOUTH DAILY 08/15/15   Claudene Rayfield HERO, MD  atorvastatin  (LIPITOR) 40 MG tablet Take 1 tablet (40 mg total) by mouth daily. Patient taking differently: Take 40 mg by mouth at bedtime. 08/15/15   Smith,  Kristi M, MD  celecoxib  (CELEBREX ) 200 MG capsule Take 1 capsule (200 mg total) by mouth 2 (two) times daily. 01/25/21   Patti Rosina SAUNDERS, PA-C  docusate sodium  (COLACE) 100 MG capsule Take 1 capsule (100 mg total) by mouth 2 (two) times daily. 01/25/21   Patti Rosina SAUNDERS, PA-C  doxycycline  (VIBRAMYCIN ) 100 MG capsule Take 1 capsule (100 mg total) by mouth 2 (two) times daily. 03/10/23   Mayer, Jodi R, NP  escitalopram  (LEXAPRO ) 20 MG tablet Take 20 mg by mouth daily. Noon 10/25/20   [provider]  estradiol  (ESTRACE ) 2 MG tablet Take 2 mg by mouth daily.    [provider]  fluconazole  (DIFLUCAN ) 150 MG tablet Day 1: Take 2 tablets. Day 2-7: Take 1 tablet daily. 12/16/23   Christopher Savannah, PA-C  Fluticasone -Salmeterol (ADVAIR) 100-50 MCG/DOSE AEPB Inhale 1 puff into the lungs daily as needed (Shortness of breath). 03/10/23   Mayer, Jodi R, NP  hydrochlorothiazide  (HYDRODIURIL ) 12.5 MG tablet Take 12.5 mg by mouth daily.    [provider]  HYDROmorphone  (DILAUDID ) 2 MG tablet Take 1-2 tablets (2-4 mg total) by mouth every 6 (six) hours as needed for severe pain. 01/27/21   Patti Rosina SAUNDERS, PA-C  hydrOXYzine  (ATARAX ) 25 MG tablet Take 1 tablet (25 mg total) by mouth 3 (three) times daily as needed for itching. 01/25/21   Patti Rosina SAUNDERS, PA-C  ipratropium-albuterol  (DUONEB) 0.5-2.5 (3) MG/3ML SOLN Take 3 mLs by nebulization every 4 (four) hours as needed. 12/16/23   Christopher Savannah, PA-C  lisinopril  (ZESTRIL ) 40 MG tablet Take 40 mg by mouth daily.    [provider]  metFORMIN  (GLUCOPHAGE ) 500 MG tablet Take 500 mg by mouth daily with breakfast. 12/28/20   [provider]  methocarbamol  (ROBAXIN ) 500 MG tablet Take 1 tablet (500 mg total) by mouth every 6 (six) hours as needed for muscle spasms. 01/25/21   Patti Rosina SAUNDERS, PA-C  montelukast  (SINGULAIR ) 10 MG tablet Take 1 tablet (10 mg total) by mouth at bedtime. 08/15/15   Smith, Kristi M, MD  polyethylene  glycol (MIRALAX  / GLYCOLAX ) 17 g packet Take 17 g by mouth daily as needed for mild constipation. 01/25/21   Patti Rosina SAUNDERS, PA-C  predniSONE  (DELTASONE ) 50 MG tablet Take 1 tablet (50 mg total) by mouth daily with breakfast. 12/16/23   Christopher Savannah, PA-C  promethazine -dextromethorphan (PROMETHAZINE -DM) 6.25-15 MG/5ML syrup Take 5 mLs by mouth 4 (four) times daily as needed for cough. 03/10/23   Loreda Myla SAUNDERS, NP    Physical Exam: BP 124/70   Pulse 86   Temp (!) 97.5 F (36.4 C) (Oral)   Resp (!) 21   SpO2 96%   General: 68 y.o. year-old female well developed well nourished in no acute distress.  Alert and oriented x3. Cardiovascular: Regular  rate and rhythm with no rubs or gallops.  No thyromegaly or JVD noted.  No lower extremity edema. 2/4 pulses in all 4 extremities. Respiratory: Diffuse wheezing.  Mild rales at bases. Good inspiratory effort. Abdomen: Soft nontender nondistended with normal bowel sounds x4 quadrants. Muskuloskeletal: No cyanosis, clubbing or edema noted bilaterally Neuro: CN II-XII intact, strength, sensation, reflexes Skin: No ulcerative lesions noted or rashes Psychiatry: Judgement and insight appear normal. Mood is appropriate for condition and setting          Labs on Admission:  Basic Metabolic Panel: Recent Labs  Lab 12/26/23 0028 12/26/23 0039  NA 135 136  K 3.7 3.7  CL 103  --   CO2 20*  --   GLUCOSE 149*  --   BUN 23  --   CREATININE 1.17*  --   CALCIUM  10.2  --    Liver Function Tests: Recent Labs  Lab 12/26/23 0028  AST 24  ALT 46*  ALKPHOS 64  BILITOT 0.7  PROT 6.4*  ALBUMIN 3.5   No results for input(s): LIPASE, AMYLASE in the last 168 hours. No results for input(s): AMMONIA in the last 168 hours. CBC: Recent Labs  Lab 12/26/23 0028 12/26/23 0039  WBC 27.8*  --   NEUTROABS 18.1*  --   HGB 13.8 14.3  HCT 43.3 42.0  MCV 88.5  --   PLT 255  --    Cardiac Enzymes: No results for input(s): CKTOTAL, CKMB,  CKMBINDEX, TROPONINI in the last 168 hours.  BNP (last 3 results) Recent Labs    12/26/23 0028  BNP 32.4    ProBNP (last 3 results) No results for input(s): PROBNP in the last 8760 hours.  CBG: No results for input(s): GLUCAP in the last 168 hours.  Radiological Exams on Admission: DG Chest Portable 1 View Result Date: 12/26/2023 EXAM: 1 VIEW(S) XRAY OF THE CHEST 12/26/2023 12:48:43 AM COMPARISON: Comparison with 12/16/2023. CLINICAL HISTORY: eval for cough/wheezing FINDINGS: LUNGS AND PLEURA: No focal pulmonary opacity. No pleural effusion. No pneumothorax. HEART AND MEDIASTINUM: No acute abnormality of the cardiac and mediastinal silhouettes. BONES AND SOFT TISSUES: No acute osseous abnormality. IMPRESSION: 1. No acute cardiopulmonary process. Electronically signed by: Norman Gatlin MD 12/26/2023 12:55 AM EST RP Workstation: [id number]    EKG: I independently viewed the EKG done and my findings are as followed: Sinus rhythm rate of 96.  QTc 467.  Assessment/Plan Present on Admission:  Acute respiratory failure with hypoxia (HCC)  Principal Problem:   Acute respiratory failure with hypoxia (HCC)  Acute hypoxic respiratory failure secondary to bronchitis with bacterial infection, possible undiagnosed asthma with exacerbation Denies formal evaluation/diagnosis of asthma or COPD Continue IV Levaquin Continue IV Solu-Medrol Continue bronchodilator nebulizers As needed antitussives Early mobilization Incentive spirometer  Bronchitis with bacterial infection, POA Presented with leukocytosis 27K, neutrophilia and bandemia Management as stated above  Hypertension Resume home Norvasc  Continue to closely monitor vital signs  Hyperlipidemia Resume home Lipitor  Tobacco use disorder Recommend complete cessation of tobacco use   Time: 75 minutes.   DVT prophylaxis: Subcu Lovenox daily  Code Status: Full code  Family Communication: None at  bedside.  Disposition Plan: Admitted to telemetry unit.  Consults called: None.  Admission status: Observation status.   Status is: Observation    Terry LOISE Hurst MD [facility] Pager [phone]  If 7PM-7AM, please contact night-coverage [email or web address] Password [email or web address]  12/26/2023, 2:37 AM

## 2023-12-26 NOTE — ED Notes (Signed)
 Assuming pt care, pt bib ems coming from home for SOB x 1 day, pt aaox4, mae, skin warm/dry, on 2 lt [place], pt reports med hx. Htn, chl, asthma. Pt denies pain/discomfort. Call bell within reach.

## 2023-12-26 NOTE — ED Notes (Signed)
 Pt has cough, pt sitting up in chair.  Sig other with pt.

## 2023-12-27 DIAGNOSIS — J9601 Acute respiratory failure with hypoxia: Secondary | ICD-10-CM | POA: Diagnosis not present

## 2023-12-27 LAB — BASIC METABOLIC PANEL WITH GFR
Anion gap: 10 (ref 5–15)
BUN: 27 mg/dL — ABNORMAL HIGH (ref 8–23)
CO2: 23 mmol/L (ref 22–32)
Calcium: 10.4 mg/dL — ABNORMAL HIGH (ref 8.9–10.3)
Chloride: 104 mmol/L (ref 98–111)
Creatinine, Ser: 1.06 mg/dL — ABNORMAL HIGH (ref 0.44–1.00)
GFR, Estimated: 57 mL/min — ABNORMAL LOW (ref >60)
Glucose, Bld: 199 mg/dL — ABNORMAL HIGH (ref 70–99)
Potassium: 5.4 mmol/L — ABNORMAL HIGH (ref 3.5–5.1)
Sodium: 137 mmol/L (ref 135–145)

## 2023-12-27 LAB — GLUCOSE, CAPILLARY
Glucose-Capillary: 190 mg/dL — ABNORMAL HIGH (ref 70–99)
Glucose-Capillary: 189 mg/dL — ABNORMAL HIGH (ref 70–99)
Glucose-Capillary: 258 mg/dL — ABNORMAL HIGH (ref 70–99)
Glucose-Capillary: 229 mg/dL — ABNORMAL HIGH (ref 70–99)

## 2023-12-27 LAB — RESPIRATORY PANEL BY PCR

## 2023-12-27 LAB — CBC
HCT: 37.9 % (ref 36.0–46.0)
Hemoglobin: 12.2 g/dL (ref 12.0–15.0)
MCH: 28 pg (ref 26.0–34.0)
MCHC: 32.2 g/dL (ref 30.0–36.0)
MCV: 86.9 fL (ref 80.0–100.0)
Platelets: 236 K/uL (ref 150–400)
RBC: 4.36 MIL/uL (ref 3.87–5.11)
RDW: 14.6 % (ref 11.5–15.5)
WBC: 38.6 K/uL — ABNORMAL HIGH (ref 4.0–10.5)
nRBC: 0 % (ref 0.0–0.2)

## 2023-12-27 LAB — HEMOGLOBIN A1C
Hgb A1c MFr Bld: 6.7 % — ABNORMAL HIGH (ref 4.8–5.6)
Mean Plasma Glucose: 145.59 mg/dL

## 2023-12-27 MED ORDER — MAGIC MOUTHWASH
5.0000 mL | Freq: Four times a day (QID) | ORAL | Status: DC
  Administered 2023-12-27 – 2023-12-29 (×11): 5 mL via ORAL
  Filled 2023-12-27 (×16): qty 5

## 2023-12-27 MED ORDER — PANTOPRAZOLE SODIUM 40 MG PO TBEC
40.0000 mg | DELAYED_RELEASE_TABLET | Freq: Every day | ORAL | Status: DC
  Administered 2023-12-27 – 2023-12-30 (×4): 40 mg via ORAL
  Filled 2023-12-27 (×4): qty 1

## 2023-12-27 MED ORDER — INSULIN ASPART 100 UNIT/ML IJ SOLN
0.0000 [IU] | Freq: Three times a day (TID) | INTRAMUSCULAR | Status: DC
  Administered 2023-12-27 (×2): 2 [IU] via SUBCUTANEOUS
  Administered 2023-12-27: 5 [IU] via SUBCUTANEOUS
  Administered 2023-12-28: 2 [IU] via SUBCUTANEOUS
  Administered 2023-12-28: 1 [IU] via SUBCUTANEOUS
  Administered 2023-12-28 – 2023-12-29 (×2): 2 [IU] via SUBCUTANEOUS
  Filled 2023-12-27 (×3): qty 2
  Filled 2023-12-27: qty 1
  Filled 2023-12-27: qty 5
  Filled 2023-12-27: qty 1

## 2023-12-27 MED ORDER — NICOTINE 21 MG/24HR TD PT24
21.0000 mg | MEDICATED_PATCH | Freq: Every day | TRANSDERMAL | Status: DC
  Administered 2023-12-27 – 2023-12-29 (×3): 21 mg via TRANSDERMAL
  Filled 2023-12-27 (×4): qty 1

## 2023-12-27 MED ORDER — SODIUM ZIRCONIUM CYCLOSILICATE 10 G PO PACK
10.0000 g | PACK | Freq: Once | ORAL | Status: AC
  Administered 2023-12-27: 10 g via ORAL
  Filled 2023-12-27: qty 1

## 2023-12-27 MED ORDER — METHYLPREDNISOLONE SODIUM SUCC 125 MG IJ SOLR
125.0000 mg | Freq: Every day | INTRAMUSCULAR | Status: AC
  Administered 2023-12-27 – 2023-12-28 (×2): 125 mg via INTRAVENOUS
  Filled 2023-12-27 (×2): qty 2

## 2023-12-27 MED ORDER — DOXYCYCLINE HYCLATE 100 MG PO TABS
100.0000 mg | ORAL_TABLET | Freq: Two times a day (BID) | ORAL | Status: DC
  Administered 2023-12-27 – 2023-12-30 (×7): 100 mg via ORAL
  Filled 2023-12-27 (×7): qty 1

## 2023-12-27 MED ORDER — GUAIFENESIN ER 600 MG PO TB12
600.0000 mg | ORAL_TABLET | Freq: Two times a day (BID) | ORAL | Status: DC
  Administered 2023-12-27 – 2023-12-30 (×7): 600 mg via ORAL
  Filled 2023-12-27 (×7): qty 1

## 2023-12-27 MED ORDER — LORATADINE 10 MG PO TABS
10.0000 mg | ORAL_TABLET | Freq: Every day | ORAL | Status: DC
  Administered 2023-12-27 – 2023-12-30 (×4): 10 mg via ORAL
  Filled 2023-12-27 (×4): qty 1

## 2023-12-27 MED ORDER — POLYETHYLENE GLYCOL 3350 17 G PO PACK
34.0000 g | PACK | Freq: Once | ORAL | Status: AC
  Administered 2023-12-27: 34 g via ORAL
  Filled 2023-12-27: qty 2

## 2023-12-27 MED ORDER — INSULIN ASPART 100 UNIT/ML IJ SOLN
0.0000 [IU] | Freq: Every day | INTRAMUSCULAR | Status: DC
  Administered 2023-12-27: 2 [IU] via SUBCUTANEOUS
  Filled 2023-12-27: qty 2

## 2023-12-27 NOTE — Plan of Care (Signed)

## 2023-12-27 NOTE — Progress Notes (Signed)
 PROGRESS NOTE    Mercedes  KOI Lucas  FMW:[id number] DOB: 16-May-1955 DOA: 12/25/2023 PCP: [name], PA    Chief Complaint  Patient presents with   Shortness of Breath    Brief Narrative:    Mercedes Lucas is a 68 y.o. female with medical history significant for essential hypertension, hyperlipidemia, current tobacco user, denies formal diagnosis of asthma or COPD, who presents to the ER due to worsening productive cough and shortness of breath for the past few days.  Her dyspnea is worse with exertion.  Initially went to urgent care on 12/16/2023 and was diagnosed with moderate persistent asthma with acute exacerbation.  She was advised to closely monitor her oxygen level at home and go to the ER if her oxygen level dropped.  Endorses hypoxemia at home with O2 saturation in the 80s on room air.  EMS was activated.  She received DuoNebs, IV Solu-Medrol, and IV magnesium  en route.     In the ER, tachycardic and tachypneic.  Hypoxic with O2 saturation of 88% on room air after several breathing treatments.  Chest x-ray was nonacute.  Due to concern for possible asthma exacerbation, bronchitis with bacterial infection, the patient was started on IV Levaquin.  Admitted by [facility], hospitalist service.   ED Course: Temperature 97.5.  BP 136/90, pulse 106, respiratory rate 23, O2 saturation 96% on 2 L.  Assessment & Plan:   Principal Problem:   Acute respiratory failure with hypoxia (HCC)    Acute hypoxic respiratory failure secondary to bronchitis with bacterial infection, possible undiagnosed asthma with exacerbation - Has been on as needed Symbicort by her PCP, has not seen pulmonary, no diagnosis of asthma or COPD . - She is with significant wheezing this morning, dyspneic, continue with IV Solu-Medrol, will increase to 125 mg .  Will add Protonix for GI prophylaxis. -On IV levofloxacin, will narrow to doxycycline  -Continue with as needed nebs. -Encouraged use incentive spirometry and  flutter valve -Viral panel is negative, COVID, RSV and ones are negative - Need ambulatory referral with pulmonary  Leukocytosis -This is most likely in the setting of steroid use as an outpatient, reviewed records at her [facility] PCP, was within normal limit recently     Hypertension Resume home Norvasc  Continue to closely monitor vital signs  Oral thrush  -most likely from Symbicort, will start on Magic mouthwash she counseled to swish and spit at her every use   Hyperlipidemia Resume home Lipitor   Tobacco use disorder Recommend complete cessation of tobacco use  Hyperkalemia -Will give Lokelma    DVT prophylaxis: (Lovenox Code Status: (Full) Family Communication: (None at bedside) Disposition:   Status is: Inpatient    Consultants:  None   Subjective:  Reports dyspnea, cough and congestion  Objective: Vitals:   12/27/23 0314 12/27/23 0407 12/27/23 0800 12/27/23 0848  BP:  122/86 123/63   Pulse: 66 76 70   Resp: 18 (!) 23 17   Temp:  97.7 F (36.5 C) 97.6 F (36.4 C)   TempSrc:  Oral Oral   SpO2:  91% 91% 94%  Weight:      Height:        Intake/Output Summary (Last 24 hours) at 12/27/2023 1334 Last data filed at 12/27/2023 0700 Gross per 24 hour  Intake 100.02 ml  Output --  Net 100.02 ml   Filed Weights   12/26/23 0311  Weight: 78.4 kg    Examination:  Awake Alert, Oriented X 3, No new F.N deficits, Normal  affect Thrush present Has significant bilateral wheezing, but no respiratory distress RRR,No Gallops,Rubs or new Murmurs, No Parasternal Heave +ve B.Sounds, Abd Soft, No tenderness, No rebound - guarding or rigidity. No Cyanosis, Clubbing or edema, No new Rash or bruise       Data Reviewed: I have personally reviewed following labs and imaging studies  CBC: Recent Labs  Lab 12/26/23 0028 12/26/23 0039 12/27/23 0232  WBC 27.8*  --  38.6*  NEUTROABS 18.1*  --   --   HGB 13.8 14.3 12.2  HCT 43.3 42.0 37.9  MCV 88.5  --  86.9   PLT 255  --  236    Basic Metabolic Panel: Recent Labs  Lab 12/26/23 0028 12/26/23 0039 12/27/23 0232  NA 135 136 137  K 3.7 3.7 5.4*  CL 103  --  104  CO2 20*  --  23  GLUCOSE 149*  --  199*  BUN 23  --  27*  CREATININE 1.17*  --  1.06*  CALCIUM  10.2  --  10.4*    GFR: Estimated Creatinine Clearance: 51.1 mL/min (A) (by C-G formula based on SCr of 1.06 mg/dL (H)).  Liver Function Tests: Recent Labs  Lab 12/26/23 0028  AST 24  ALT 46*  ALKPHOS 64  BILITOT 0.7  PROT 6.4*  ALBUMIN 3.5    CBG: Recent Labs  Lab 12/27/23 0759 12/27/23 1310  GLUCAP 190* 189*     Recent Results (from the past 240 hours)  Resp panel by RT-PCR (RSV, Flu A&B, Covid) Anterior Nasal Swab     Status: None   Collection Time: 12/26/23  1:49 AM   Specimen: Anterior Nasal Swab  Result Value Ref Range Status   SARS Coronavirus 2 by RT PCR NEGATIVE NEGATIVE Final   Influenza A by PCR NEGATIVE NEGATIVE Final   Influenza B by PCR NEGATIVE NEGATIVE Final    Comment: (NOTE) The Xpert Xpress SARS-CoV-2/FLU/RSV plus assay is intended as an aid in the diagnosis of influenza from Nasopharyngeal swab specimens and should not be used as a sole basis for treatment. Nasal washings and aspirates are unacceptable for Xpert Xpress SARS-CoV-2/FLU/RSV testing.  Fact Sheet for Patients: bloggercourse.com  Fact Sheet for Healthcare Providers: seriousbroker.it  This test is not yet approved or cleared by the [place]  FDA and has been authorized for detection and/or diagnosis of SARS-CoV-2 by FDA under an Emergency Use Authorization (EUA). This EUA will remain in effect (meaning this test can be used) for the duration of the COVID-19 declaration under Section 564(b)(1) of the Act, 21 U.S.C. section 360bbb-3(b)(1), unless the authorization is terminated or revoked.     Resp Syncytial Virus by PCR NEGATIVE NEGATIVE Final    Comment: (NOTE) Fact  Sheet for Patients: bloggercourse.com  Fact Sheet for Healthcare Providers: seriousbroker.it  This test is not yet approved or cleared by the [place]  FDA and has been authorized for detection and/or diagnosis of SARS-CoV-2 by FDA under an Emergency Use Authorization (EUA). This EUA will remain in effect (meaning this test can be used) for the duration of the COVID-19 declaration under Section 564(b)(1) of the Act, 21 U.S.C. section 360bbb-3(b)(1), unless the authorization is terminated or revoked.  Performed at [facility], [address], [place], [place] [address]   Respiratory (~20 pathogens) panel by PCR     Status: None   Collection Time: 12/27/23  5:32 AM   Specimen: Nasopharyngeal Swab; Respiratory  Result Value Ref Range Status   Adenovirus NOT DETECTED  NOT DETECTED Final   Coronavirus 229E NOT DETECTED NOT DETECTED Final    Comment: (NOTE) The Coronavirus on the Respiratory Panel, DOES NOT test for the novel  Coronavirus (2019 nCoV)    Coronavirus HKU1 NOT DETECTED NOT DETECTED Final   Coronavirus NL63 NOT DETECTED NOT DETECTED Final   Coronavirus OC43 NOT DETECTED NOT DETECTED Final   Metapneumovirus NOT DETECTED NOT DETECTED Final   Rhinovirus / Enterovirus NOT DETECTED NOT DETECTED Final   Influenza A NOT DETECTED NOT DETECTED Final   Influenza B NOT DETECTED NOT DETECTED Final   Parainfluenza Virus 1 NOT DETECTED NOT DETECTED Final   Parainfluenza Virus 2 NOT DETECTED NOT DETECTED Final   Parainfluenza Virus 3 NOT DETECTED NOT DETECTED Final   Parainfluenza Virus 4 NOT DETECTED NOT DETECTED Final   Respiratory Syncytial Virus NOT DETECTED NOT DETECTED Final   Bordetella pertussis NOT DETECTED NOT DETECTED Final   Bordetella Parapertussis NOT DETECTED NOT DETECTED Final   Chlamydophila pneumoniae NOT DETECTED NOT DETECTED Final   Mycoplasma pneumoniae NOT DETECTED NOT DETECTED Final    Comment:  Performed at [facility], [address], [place], [place] [address]         Radiology Studies: DG Chest Portable 1 View Result Date: 12/26/2023 EXAM: 1 VIEW(S) XRAY OF THE CHEST 12/26/2023 12:48:43 AM COMPARISON: Comparison with 12/16/2023. CLINICAL HISTORY: eval for cough/wheezing FINDINGS: LUNGS AND PLEURA: No focal pulmonary opacity. No pleural effusion. No pneumothorax. HEART AND MEDIASTINUM: No acute abnormality of the cardiac and mediastinal silhouettes. BONES AND SOFT TISSUES: No acute osseous abnormality. IMPRESSION: 1. No acute cardiopulmonary process. Electronically signed by: Norman Gatlin MD 12/26/2023 12:55 AM EST RP Workstation: [id number]        Scheduled Meds:  amLODipine   5 mg Oral Daily   atorvastatin   40 mg Oral QHS   enoxaparin (LOVENOX) injection  40 mg Subcutaneous Q24H   guaiFENesin  600 mg Oral BID   insulin aspart  0-5 Units Subcutaneous QHS   insulin aspart  0-9 Units Subcutaneous TID WC   ipratropium-albuterol   3 mL Nebulization Q6H   loratadine  10 mg Oral Daily   magic mouthwash  5 mL Oral QID   methylPREDNISolone (SOLU-MEDROL) injection  125 mg Intravenous Daily   nicotine   21 mg Transdermal Daily   pantoprazole  40 mg Oral Daily   Continuous Infusions:  albuterol  10 mg (12/26/23 0156)   levofloxacin (LEVAQUIN) IV Stopped (12/26/23 2304)     LOS: 1 day       Annete Ayuso, MD [facility]   To contact the attending provider between 7A-7P or the covering provider during after hours 7P-7A, please log into the web site [email or web address] and access using universal [place] password for that web site. If you do not have the password, please call the hospital operator.  12/27/2023, 1:34 PM
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
## 2023-12-27 NOTE — Progress Notes (Signed)
 Ok to give lokelma 10g x1 for k 5.4 per Dr. Sherlon.  Sergio Batch, PharmD, BCIDP, AAHIVP, CPP Infectious Disease Pharmacist 12/27/2023 10:57 AM

## 2023-12-28 DIAGNOSIS — J9601 Acute respiratory failure with hypoxia: Secondary | ICD-10-CM | POA: Diagnosis not present

## 2023-12-28 LAB — BASIC METABOLIC PANEL WITH GFR
Anion gap: 9 (ref 5–15)
BUN: 27 mg/dL — ABNORMAL HIGH (ref 8–23)
CO2: 22 mmol/L (ref 22–32)
Calcium: 10 mg/dL (ref 8.9–10.3)
Chloride: 106 mmol/L (ref 98–111)
Creatinine, Ser: 0.92 mg/dL (ref 0.44–1.00)
GFR, Estimated: >60 mL/min (ref >60)
Glucose, Bld: 154 mg/dL — ABNORMAL HIGH (ref 70–99)
Potassium: 4.4 mmol/L (ref 3.5–5.1)
Sodium: 137 mmol/L (ref 135–145)

## 2023-12-28 LAB — CBC
HCT: 39 % (ref 36.0–46.0)
Hemoglobin: 12.6 g/dL (ref 12.0–15.0)
MCH: 28.1 pg (ref 26.0–34.0)
MCHC: 32.3 g/dL (ref 30.0–36.0)
MCV: 86.9 fL (ref 80.0–100.0)
Platelets: 236 K/uL (ref 150–400)
RBC: 4.49 MIL/uL (ref 3.87–5.11)
RDW: 14.7 % (ref 11.5–15.5)
WBC: 33.6 K/uL — ABNORMAL HIGH (ref 4.0–10.5)
nRBC: 0 % (ref 0.0–0.2)

## 2023-12-28 LAB — GLUCOSE, CAPILLARY
Glucose-Capillary: 121 mg/dL — ABNORMAL HIGH (ref 70–99)
Glucose-Capillary: 171 mg/dL — ABNORMAL HIGH (ref 70–99)
Glucose-Capillary: 162 mg/dL — ABNORMAL HIGH (ref 70–99)
Glucose-Capillary: 143 mg/dL — ABNORMAL HIGH (ref 70–99)

## 2023-12-28 MED ORDER — METFORMIN HCL 500 MG PO TABS
500.0000 mg | ORAL_TABLET | Freq: Two times a day (BID) | ORAL | Status: DC
  Administered 2023-12-28 – 2023-12-30 (×5): 500 mg via ORAL
  Filled 2023-12-28 (×5): qty 1

## 2023-12-28 MED ORDER — IPRATROPIUM-ALBUTEROL 0.5-2.5 (3) MG/3ML IN SOLN
3.0000 mL | Freq: Two times a day (BID) | RESPIRATORY_TRACT | Status: DC
  Administered 2023-12-28 – 2023-12-30 (×4): 3 mL via RESPIRATORY_TRACT
  Filled 2023-12-28 (×5): qty 3

## 2023-12-28 NOTE — Progress Notes (Signed)
 PROGRESS NOTE    Mercedes  SADIE Lucas  FMW:[id number] DOB: 03/10/1955 DOA: 12/25/2023 PCP: [name], PA    Chief Complaint  Patient presents with   Shortness of Breath    Brief Narrative:    Mercedes  L Lucas is a 68 y.o. female with medical history significant for essential hypertension, hyperlipidemia, current tobacco user, denies formal diagnosis of asthma or COPD, who presents to the ER due to worsening productive cough and shortness of breath for the past few days.  Her dyspnea is worse with exertion.  Initially went to urgent care on 12/16/2023 and was diagnosed with moderate persistent asthma with acute exacerbation.  She was advised to closely monitor her oxygen level at home and go to the ER if her oxygen level dropped.  Endorses hypoxemia at home with O2 saturation in the 80s on room air.  EMS was activated.  She received DuoNebs, IV Solu-Medrol, and IV magnesium  en route.     In the ER, tachycardic and tachypneic.  Hypoxic with O2 saturation of 88% on room air after several breathing treatments.  Chest x-ray was nonacute.  Due to concern for possible asthma exacerbation, bronchitis with bacterial infection, the patient was started on IV Levaquin.  Admitted by [facility], hospitalist service.   ED Course: Temperature 97.5.  BP 136/90, pulse 106, respiratory rate 23, O2 saturation 96% on 2 L.  Assessment & Plan:   Principal Problem:   Acute respiratory failure with hypoxia (HCC)    Acute hypoxic respiratory failure  Acute bronchitis  COPD versus asthma exacerbation  - Has been on as needed Symbicort by her PCP, has not seen pulmonary, no diagnosis of asthma or COPD . - Dyspnea much improved, but to continue with current days of IV Solu-Medrol 125 mg daily given still dyspneic with activity , Will add Protonix for GI prophylaxis. -On IV levofloxacin, will narrow to doxycycline  -Continue with as needed nebs. -Encouraged use incentive spirometry and flutter valve -Viral panel is  negative, COVID, RSV and ones are negative - Need ambulatory referral with pulmonary  Leukocytosis -This is most likely in the setting of steroid use as an outpatient, reviewed records at her [facility] PCP, was within normal limit recently     Hypertension Resume home Norvasc  Continue to closely monitor vital signs  Oral thrush  -most likely from Symbicort, will start on Magic mouthwash she counseled to swish and spit at her every use   Hyperlipidemia Resume home Lipitor   Tobacco use disorder Recommend complete cessation of tobacco use  Hyperkalemia - Resolved with Lokelma  Diabetes mellitus -A1c is 6.7, CBG uncontrolled due to steroids -Continue with insulin sliding scale, will add metformin     DVT prophylaxis: (Lovenox Code Status: (Full) Family Communication: (None at bedside) Disposition:   Status is: Inpatient    Consultants:  None   Subjective:  Reports dyspnea has improved, still reports cough and congestion  Objective: Vitals:   12/28/23 0455 12/28/23 0831 12/28/23 0844 12/28/23 1138  BP: (!) 126/94  (!) 145/77 (!) 146/72  Pulse:  (!) 136 62 65  Resp: 20 18 18 17   Temp: (!) 97.5 F (36.4 C)   (!) 97.5 F (36.4 C)  TempSrc: Oral   Oral  SpO2:  100% 94% 93%  Weight:      Height:       No intake or output data in the 24 hours ending 12/28/23 1301  Filed Weights   12/26/23 0311  Weight: 78.4 kg    Examination:  Awake Alert, Oriented X 3, No new F.N deficits, Normal affect Improved air entry bilaterally, wheezing significantly improved RRR,No Gallops,Rubs or new Murmurs, No Parasternal Heave +ve B.Sounds, Abd Soft, No tenderness, No rebound - guarding or rigidity. No Cyanosis, Clubbing or edema, No new Rash or bruise        Data Reviewed: I have personally reviewed following labs and imaging studies  CBC: Recent Labs  Lab 12/26/23 0028 12/26/23 0039 12/27/23 0232 12/28/23 0350  WBC 27.8*  --  38.6* 33.6*  NEUTROABS 18.1*  --   --    --   HGB 13.8 14.3 12.2 12.6  HCT 43.3 42.0 37.9 39.0  MCV 88.5  --  86.9 86.9  PLT 255  --  236 236    Basic Metabolic Panel: Recent Labs  Lab 12/26/23 0028 12/26/23 0039 12/27/23 0232 12/28/23 0350  NA 135 136 137 137  K 3.7 3.7 5.4* 4.4  CL 103  --  104 106  CO2 20*  --  23 22  GLUCOSE 149*  --  199* 154*  BUN 23  --  27* 27*  CREATININE 1.17*  --  1.06* 0.92  CALCIUM  10.2  --  10.4* 10.0    GFR: Estimated Creatinine Clearance: 58.9 mL/min (by C-G formula based on SCr of 0.92 mg/dL).  Liver Function Tests: Recent Labs  Lab 12/26/23 0028  AST 24  ALT 46*  ALKPHOS 64  BILITOT 0.7  PROT 6.4*  ALBUMIN 3.5    CBG: Recent Labs  Lab 12/27/23 0759 12/27/23 1310 12/27/23 1637 12/27/23 2100 12/28/23 0902  GLUCAP 190* 189* 258* 229* 121*     Recent Results (from the past 240 hours)  Resp panel by RT-PCR (RSV, Flu A&B, Covid) Anterior Nasal Swab     Status: None   Collection Time: 12/26/23  1:49 AM   Specimen: Anterior Nasal Swab  Result Value Ref Range Status   SARS Coronavirus 2 by RT PCR NEGATIVE NEGATIVE Final   Influenza A by PCR NEGATIVE NEGATIVE Final   Influenza B by PCR NEGATIVE NEGATIVE Final    Comment: (NOTE) The Xpert Xpress SARS-CoV-2/FLU/RSV plus assay is intended as an aid in the diagnosis of influenza from Nasopharyngeal swab specimens and should not be used as a sole basis for treatment. Nasal washings and aspirates are unacceptable for Xpert Xpress SARS-CoV-2/FLU/RSV testing.  Fact Sheet for Patients: bloggercourse.com  Fact Sheet for Healthcare Providers: seriousbroker.it  This test is not yet approved or cleared by the [place]  FDA and has been authorized for detection and/or diagnosis of SARS-CoV-2 by FDA under an Emergency Use Authorization (EUA). This EUA will remain in effect (meaning this test can be used) for the duration of the COVID-19 declaration under Section  564(b)(1) of the Act, 21 U.S.C. section 360bbb-3(b)(1), unless the authorization is terminated or revoked.     Resp Syncytial Virus by PCR NEGATIVE NEGATIVE Final    Comment: (NOTE) Fact Sheet for Patients: bloggercourse.com  Fact Sheet for Healthcare Providers: seriousbroker.it  This test is not yet approved or cleared by the [place]  FDA and has been authorized for detection and/or diagnosis of SARS-CoV-2 by FDA under an Emergency Use Authorization (EUA). This EUA will remain in effect (meaning this test can be used) for the duration of the COVID-19 declaration under Section 564(b)(1) of the Act, 21 U.S.C. section 360bbb-3(b)(1), unless the authorization is terminated or revoked.  Performed at [facility], [address], [place], [place] [address]   Respiratory (~20  pathogens) panel by PCR     Status: None   Collection Time: 12/27/23  5:32 AM   Specimen: Nasopharyngeal Swab; Respiratory  Result Value Ref Range Status   Adenovirus NOT DETECTED NOT DETECTED Final   Coronavirus 229E NOT DETECTED NOT DETECTED Final    Comment: (NOTE) The Coronavirus on the Respiratory Panel, DOES NOT test for the novel  Coronavirus (2019 nCoV)    Coronavirus HKU1 NOT DETECTED NOT DETECTED Final   Coronavirus NL63 NOT DETECTED NOT DETECTED Final   Coronavirus OC43 NOT DETECTED NOT DETECTED Final   Metapneumovirus NOT DETECTED NOT DETECTED Final   Rhinovirus / Enterovirus NOT DETECTED NOT DETECTED Final   Influenza A NOT DETECTED NOT DETECTED Final   Influenza B NOT DETECTED NOT DETECTED Final   Parainfluenza Virus 1 NOT DETECTED NOT DETECTED Final   Parainfluenza Virus 2 NOT DETECTED NOT DETECTED Final   Parainfluenza Virus 3 NOT DETECTED NOT DETECTED Final   Parainfluenza Virus 4 NOT DETECTED NOT DETECTED Final   Respiratory Syncytial Virus NOT DETECTED NOT DETECTED Final   Bordetella pertussis NOT DETECTED NOT DETECTED Final    Bordetella Parapertussis NOT DETECTED NOT DETECTED Final   Chlamydophila pneumoniae NOT DETECTED NOT DETECTED Final   Mycoplasma pneumoniae NOT DETECTED NOT DETECTED Final    Comment: Performed at [facility], [address] [place], [place] [address]         Radiology Studies: No results found.       Scheduled Meds:  amLODipine   5 mg Oral Daily   atorvastatin   40 mg Oral QHS   doxycycline   100 mg Oral Q12H   enoxaparin (LOVENOX) injection  40 mg Subcutaneous Q24H   guaiFENesin  600 mg Oral BID   insulin aspart  0-5 Units Subcutaneous QHS   insulin aspart  0-9 Units Subcutaneous TID WC   ipratropium-albuterol   3 mL Nebulization Q6H   loratadine  10 mg Oral Daily   magic mouthwash  5 mL Oral QID   metFORMIN   500 mg Oral BID WC   nicotine   21 mg Transdermal Daily   pantoprazole  40 mg Oral Daily   Continuous Infusions:  albuterol  10 mg (12/26/23 0156)     LOS: 2 days       Brayton Lye, MD [facility]   To contact the attending provider between 7A-7P or the covering provider during after hours 7P-7A, please log into the web site [email or web address] and access using universal [place] password for that web site. If you do not have the password, please call the hospital operator.  12/28/2023, 1:01 PM

## 2023-12-28 NOTE — Plan of Care (Signed)

## 2023-12-28 NOTE — Plan of Care (Signed)

## 2023-12-29 ENCOUNTER — Inpatient Hospital Stay (HOSPITAL_COMMUNITY)

## 2023-12-29 DIAGNOSIS — J9601 Acute respiratory failure with hypoxia: Secondary | ICD-10-CM | POA: Diagnosis not present

## 2023-12-29 LAB — CBC
HCT: 38.4 % (ref 36.0–46.0)
Hemoglobin: 12.4 g/dL (ref 12.0–15.0)
MCH: 28.1 pg (ref 26.0–34.0)
MCHC: 32.3 g/dL (ref 30.0–36.0)
MCV: 86.9 fL (ref 80.0–100.0)
Platelets: 222 K/uL (ref 150–400)
RBC: 4.42 MIL/uL (ref 3.87–5.11)
RDW: 14.8 % (ref 11.5–15.5)
WBC: 28.5 K/uL — ABNORMAL HIGH (ref 4.0–10.5)
nRBC: 0 % (ref 0.0–0.2)

## 2023-12-29 LAB — BASIC METABOLIC PANEL WITH GFR
Anion gap: 8 (ref 5–15)
BUN: 30 mg/dL — ABNORMAL HIGH (ref 8–23)
CO2: 25 mmol/L (ref 22–32)
Calcium: 10.4 mg/dL — ABNORMAL HIGH (ref 8.9–10.3)
Chloride: 104 mmol/L (ref 98–111)
Creatinine, Ser: 0.95 mg/dL (ref 0.44–1.00)
GFR, Estimated: >60 mL/min
Glucose, Bld: 108 mg/dL — ABNORMAL HIGH (ref 70–99)
Potassium: 5.1 mmol/L (ref 3.5–5.1)
Sodium: 137 mmol/L (ref 135–145)

## 2023-12-29 LAB — GLUCOSE, CAPILLARY
Glucose-Capillary: 95 mg/dL (ref 70–99)
Glucose-Capillary: 103 mg/dL — ABNORMAL HIGH (ref 70–99)
Glucose-Capillary: 164 mg/dL — ABNORMAL HIGH (ref 70–99)
Glucose-Capillary: 144 mg/dL — ABNORMAL HIGH (ref 70–99)

## 2023-12-29 MED ORDER — METHYLPREDNISOLONE SODIUM SUCC 40 MG IJ SOLR
40.0000 mg | Freq: Every day | INTRAMUSCULAR | Status: DC
  Administered 2023-12-29 – 2023-12-30 (×2): 40 mg via INTRAVENOUS
  Filled 2023-12-29 (×2): qty 1

## 2023-12-29 MED ORDER — SENNOSIDES-DOCUSATE SODIUM 8.6-50 MG PO TABS
1.0000 | ORAL_TABLET | Freq: Once | ORAL | Status: AC
  Administered 2023-12-29: 1 via ORAL
  Filled 2023-12-29: qty 1

## 2023-12-29 MED ORDER — POLYETHYLENE GLYCOL 3350 17 G PO PACK
17.0000 g | PACK | Freq: Once | ORAL | Status: AC
  Administered 2023-12-29: 17 g via ORAL
  Filled 2023-12-29: qty 1

## 2023-12-29 MED ORDER — BENZONATATE 100 MG PO CAPS: 100.0000 mg | ORAL_CAPSULE | Freq: Three times a day (TID) | ORAL | Status: DC | PRN

## 2023-12-29 NOTE — Progress Notes (Signed)
 PROGRESS NOTE    Mercedes  ZAINEB Lucas  FMW:[id number] DOB: 11-23-1955 DOA: 12/25/2023 PCP: [name], PA    Chief Complaint  Patient presents with   Shortness of Breath    Brief Narrative:    Mercedes  L Lucas is a 68 y.o. female with medical history significant for essential hypertension, hyperlipidemia, current tobacco user, denies formal diagnosis of asthma or COPD, who presents to the ER due to worsening productive cough and shortness of breath for the past few days.  Her dyspnea is worse with exertion.  Initially went to urgent care on 12/16/2023 and was diagnosed with moderate persistent asthma with acute exacerbation.  She was advised to closely monitor her oxygen level at home and go to the ER if her oxygen level dropped.  Endorses hypoxemia at home with O2 saturation in the 80s on room air.  EMS was activated.  She received DuoNebs, IV Solu-Medrol, and IV magnesium  en route.     In the ER, tachycardic and tachypneic.  Hypoxic with O2 saturation of 88% on room air after several breathing treatments.  Chest x-ray was nonacute.  Due to concern for possible asthma exacerbation, bronchitis with bacterial infection, the patient was started on IV Levaquin.  Admitted by [facility], hospitalist service.   ED Course: Temperature 97.5.  BP 136/90, pulse 106, respiratory rate 23, O2 saturation 96% on 2 L.  Assessment & Plan:   Principal Problem:   Acute respiratory failure with hypoxia (HCC)    Acute hypoxic respiratory failure  Acute bronchitis  COPD versus asthma exacerbation  - Has been on as needed Symbicort by her PCP, has not seen pulmonary, no diagnosis of asthma or COPD . - On IV Solu-Medrol, wheezing has improved, will decrease her Medrol to 40 mg IV twice daily . - Reports productive phlegm today and worsening cough I will obtain chest x-ray  -Protonix for GI prophylaxis. -On IV levofloxacin, will narrow to doxycycline  -Continue with as needed nebs. -Encouraged use incentive  spirometry and flutter valve -Viral panel is negative, COVID, RSV and ones are negative - Need ambulatory referral with pulmonary  Leukocytosis -This is most likely in the setting of steroid use as an outpatient, reviewed records at her [facility] PCP, was within normal limit recently     Hypertension Resume home Norvasc  Continue to closely monitor vital signs  Oral thrush  -most likely from Symbicort, will start on Magic mouthwash she counseled to swish and spit at her every use   Hyperlipidemia Resume home Lipitor   Tobacco use disorder Recommend complete cessation of tobacco use  Hyperkalemia - Resolved with Lokelma  Diabetes mellitus -A1c is 6.7, CBG uncontrolled due to steroids -Continue with insulin sliding scale, will add metformin     DVT prophylaxis: (Lovenox Code Status: (Full) Family Communication: (None at bedside) Disposition:   Status is: Inpatient    Consultants:  None   Subjective:  Dyspnea has improved, reports cough with yellow phlegm  Objective: Vitals:   12/28/23 2002 12/29/23 0810 12/29/23 0815 12/29/23 1200  BP:   (!) 147/74   Pulse:   74   Resp:   17   Temp:   97.7 F (36.5 C) (!) 97.5 F (36.4 C)  TempSrc:   Oral Oral  SpO2: 97% 96% 99%   Weight:      Height:       No intake or output data in the 24 hours ending 12/29/23 1258  Filed Weights   12/26/23 0311  Weight: 78.4 kg  Examination:  Awake Alert, Oriented X 3, No new F.N deficits, Normal affect Symmetrical Chest wall movement, Good air movement bilaterally,  no wheezing today, upper airway congestion present RRR,No Gallops,Rubs or new Murmurs, No Parasternal Heave +ve B.Sounds, Abd Soft, No tenderness, No rebound - guarding or rigidity. No Cyanosis, Clubbing or edema, No new Rash or bruise        Data Reviewed: I have personally reviewed following labs and imaging studies  CBC: Recent Labs  Lab 12/26/23 0028 12/26/23 0039 12/27/23 0232 12/28/23 0350  12/29/23 0426  WBC 27.8*  --  38.6* 33.6* 28.5*  NEUTROABS 18.1*  --   --   --   --   HGB 13.8 14.3 12.2 12.6 12.4  HCT 43.3 42.0 37.9 39.0 38.4  MCV 88.5  --  86.9 86.9 86.9  PLT 255  --  236 236 222    Basic Metabolic Panel: Recent Labs  Lab 12/26/23 0028 12/26/23 0039 12/27/23 0232 12/28/23 0350 12/29/23 0426  NA 135 136 137 137 137  K 3.7 3.7 5.4* 4.4 5.1  CL 103  --  104 106 104  CO2 20*  --  23 22 25   GLUCOSE 149*  --  199* 154* 108*  BUN 23  --  27* 27* 30*  CREATININE 1.17*  --  1.06* 0.92 0.95  CALCIUM  10.2  --  10.4* 10.0 10.4*    GFR: Estimated Creatinine Clearance: 57.1 mL/min (by C-G formula based on SCr of 0.95 mg/dL).  Liver Function Tests: Recent Labs  Lab 12/26/23 0028  AST 24  ALT 46*  ALKPHOS 64  BILITOT 0.7  PROT 6.4*  ALBUMIN 3.5    CBG: Recent Labs  Lab 12/28/23 1320 12/28/23 1619 12/28/23 2104 12/29/23 0813 12/29/23 1203  GLUCAP 171* 162* 143* 95 103*     Recent Results (from the past 240 hours)  Resp panel by RT-PCR (RSV, Flu A&B, Covid) Anterior Nasal Swab     Status: None   Collection Time: 12/26/23  1:49 AM   Specimen: Anterior Nasal Swab  Result Value Ref Range Status   SARS Coronavirus 2 by RT PCR NEGATIVE NEGATIVE Final   Influenza A by PCR NEGATIVE NEGATIVE Final   Influenza B by PCR NEGATIVE NEGATIVE Final    Comment: (NOTE) The Xpert Xpress SARS-CoV-2/FLU/RSV plus assay is intended as an aid in the diagnosis of influenza from Nasopharyngeal swab specimens and should not be used as a sole basis for treatment. Nasal washings and aspirates are unacceptable for Xpert Xpress SARS-CoV-2/FLU/RSV testing.  Fact Sheet for Patients: bloggercourse.com  Fact Sheet for Healthcare Providers: seriousbroker.it  This test is not yet approved or cleared by the [place]  FDA and has been authorized for detection and/or diagnosis of SARS-CoV-2 by FDA under an Emergency Use  Authorization (EUA). This EUA will remain in effect (meaning this test can be used) for the duration of the COVID-19 declaration under Section 564(b)(1) of the Act, 21 U.S.C. section 360bbb-3(b)(1), unless the authorization is terminated or revoked.     Resp Syncytial Virus by PCR NEGATIVE NEGATIVE Final    Comment: (NOTE) Fact Sheet for Patients: bloggercourse.com  Fact Sheet for Healthcare Providers: seriousbroker.it  This test is not yet approved or cleared by the [place]  FDA and has been authorized for detection and/or diagnosis of SARS-CoV-2 by FDA under an Emergency Use Authorization (EUA). This EUA will remain in effect (meaning this test can be used) for the duration of the COVID-19 declaration under Section 564(b)(1) of  the Act, 21 U.S.C. section 360bbb-3(b)(1), unless the authorization is terminated or revoked.  Performed at [facility], [address], [place], [place] [address]   Respiratory (~20 pathogens) panel by PCR     Status: None   Collection Time: 12/27/23  5:32 AM   Specimen: Nasopharyngeal Swab; Respiratory  Result Value Ref Range Status   Adenovirus NOT DETECTED NOT DETECTED Final   Coronavirus 229E NOT DETECTED NOT DETECTED Final    Comment: (NOTE) The Coronavirus on the Respiratory Panel, DOES NOT test for the novel  Coronavirus (2019 nCoV)    Coronavirus HKU1 NOT DETECTED NOT DETECTED Final   Coronavirus NL63 NOT DETECTED NOT DETECTED Final   Coronavirus OC43 NOT DETECTED NOT DETECTED Final   Metapneumovirus NOT DETECTED NOT DETECTED Final   Rhinovirus / Enterovirus NOT DETECTED NOT DETECTED Final   Influenza A NOT DETECTED NOT DETECTED Final   Influenza B NOT DETECTED NOT DETECTED Final   Parainfluenza Virus 1 NOT DETECTED NOT DETECTED Final   Parainfluenza Virus 2 NOT DETECTED NOT DETECTED Final   Parainfluenza Virus 3 NOT DETECTED NOT DETECTED Final   Parainfluenza Virus 4 NOT  DETECTED NOT DETECTED Final   Respiratory Syncytial Virus NOT DETECTED NOT DETECTED Final   Bordetella pertussis NOT DETECTED NOT DETECTED Final   Bordetella Parapertussis NOT DETECTED NOT DETECTED Final   Chlamydophila pneumoniae NOT DETECTED NOT DETECTED Final   Mycoplasma pneumoniae NOT DETECTED NOT DETECTED Final    Comment: Performed at [facility], [address] [place], [place] [address]         Radiology Studies: No results found.       Scheduled Meds:  amLODipine   5 mg Oral Daily   atorvastatin   40 mg Oral QHS   doxycycline   100 mg Oral Q12H   enoxaparin (LOVENOX) injection  40 mg Subcutaneous Q24H   guaiFENesin  600 mg Oral BID   insulin aspart  0-5 Units Subcutaneous QHS   insulin aspart  0-9 Units Subcutaneous TID WC   ipratropium-albuterol   3 mL Nebulization BID   loratadine  10 mg Oral Daily   magic mouthwash  5 mL Oral QID   metFORMIN   500 mg Oral BID WC   methylPREDNISolone (SOLU-MEDROL) injection  40 mg Intravenous Daily   nicotine   21 mg Transdermal Daily   pantoprazole  40 mg Oral Daily   Continuous Infusions:  albuterol  10 mg (12/26/23 0156)     LOS: 3 days       Brayton Lye, MD [facility]   To contact the attending provider between 7A-7P or the covering provider during after hours 7P-7A, please log into the web site [email or web address] and access using universal [place] password for that web site. If you do not have the password, please call the hospital operator.  12/29/2023, 12:58 PM

## 2023-12-29 NOTE — Plan of Care (Signed)
   Problem: Clinical Measurements: Goal: Diagnostic test results will improve Outcome: Progressing Goal: Respiratory complications will improve Outcome: Progressing

## 2023-12-29 NOTE — Plan of Care (Signed)

## 2023-12-30 ENCOUNTER — Other Ambulatory Visit (HOSPITAL_COMMUNITY): Payer: Self-pay

## 2023-12-30 DIAGNOSIS — J9601 Acute respiratory failure with hypoxia: Secondary | ICD-10-CM | POA: Diagnosis not present

## 2023-12-30 LAB — BASIC METABOLIC PANEL WITH GFR
Anion gap: 7 (ref 5–15)
BUN: 30 mg/dL — ABNORMAL HIGH (ref 8–23)
CO2: 27 mmol/L (ref 22–32)
Calcium: 10.8 mg/dL — ABNORMAL HIGH (ref 8.9–10.3)
Chloride: 103 mmol/L (ref 98–111)
Creatinine, Ser: 0.93 mg/dL (ref 0.44–1.00)
GFR, Estimated: >60 mL/min (ref >60)
Glucose, Bld: 136 mg/dL — ABNORMAL HIGH (ref 70–99)
Potassium: 4.6 mmol/L (ref 3.5–5.1)
Sodium: 137 mmol/L (ref 135–145)

## 2023-12-30 LAB — GLUCOSE, CAPILLARY: Glucose-Capillary: 104 mg/dL — ABNORMAL HIGH (ref 70–99)

## 2023-12-30 LAB — CBC
HCT: 41.7 % (ref 36.0–46.0)
Hemoglobin: 13.4 g/dL (ref 12.0–15.0)
MCH: 28.2 pg (ref 26.0–34.0)
MCHC: 32.1 g/dL (ref 30.0–36.0)
MCV: 87.6 fL (ref 80.0–100.0)
Platelets: 227 K/uL (ref 150–400)
RBC: 4.76 MIL/uL (ref 3.87–5.11)
RDW: 14.9 % (ref 11.5–15.5)
WBC: 22.8 K/uL — ABNORMAL HIGH (ref 4.0–10.5)
nRBC: 0 % (ref 0.0–0.2)

## 2023-12-30 MED ORDER — METFORMIN HCL 500 MG PO TABS
500.0000 mg | ORAL_TABLET | Freq: Two times a day (BID) | ORAL | 0 refills | Status: AC
  Filled 2023-12-30: qty 60, 30d supply, fill #0

## 2023-12-30 MED ORDER — PREDNISONE 10 MG (21) PO TBPK
ORAL_TABLET | ORAL | 0 refills | Status: AC
  Filled 2023-12-30: qty 21, 7d supply, fill #0

## 2023-12-30 MED ORDER — MAGIC MOUTHWASH
5.0000 mL | Freq: Four times a day (QID) | ORAL | 0 refills | Status: DC
  Filled 2023-12-30: qty 120, 6d supply, fill #0

## 2023-12-30 MED ORDER — PANTOPRAZOLE SODIUM 40 MG PO TBEC
40.0000 mg | DELAYED_RELEASE_TABLET | Freq: Every day | ORAL | 0 refills | Status: AC
  Filled 2023-12-30: qty 30, 30d supply, fill #0

## 2023-12-30 MED ORDER — NICOTINE 21 MG/24HR TD PT24
21.0000 mg | MEDICATED_PATCH | Freq: Every day | TRANSDERMAL | 0 refills | Status: AC
  Filled 2023-12-30: qty 28, 28d supply, fill #0

## 2023-12-30 MED ORDER — ALBUTEROL SULFATE (2.5 MG/3ML) 0.083% IN NEBU
10.0000 mg | INHALATION_SOLUTION | RESPIRATORY_TRACT | 0 refills | Status: DC
  Filled 2023-12-30: qty 90, 8d supply, fill #0

## 2023-12-30 MED ORDER — MONTELUKAST SODIUM 10 MG PO TABS
10.0000 mg | ORAL_TABLET | Freq: Every day | ORAL | 0 refills | Status: AC
  Filled 2023-12-30: qty 30, 30d supply, fill #0

## 2023-12-30 MED ORDER — FLUTICASONE-SALMETEROL 100-50 MCG/DOSE IN AEPB: 1.0000 | INHALATION_SPRAY | Freq: Every day | RESPIRATORY_TRACT | 0 refills | Status: DC

## 2023-12-30 MED ORDER — IPRATROPIUM-ALBUTEROL 0.5-2.5 (3) MG/3ML IN SOLN
3.0000 mL | RESPIRATORY_TRACT | 0 refills | Status: AC | PRN
  Filled 2023-12-30: qty 90, 5d supply, fill #0

## 2023-12-30 MED ORDER — DOXYCYCLINE HYCLATE 100 MG PO TABS
100.0000 mg | ORAL_TABLET | Freq: Two times a day (BID) | ORAL | 0 refills | Status: AC
  Filled 2023-12-30: qty 6, 3d supply, fill #0

## 2023-12-30 NOTE — Discharge Instructions (Signed)
Follow with Primary MD Wilfrid Lund, PA in 7 days   Get CBC, CMP, checked  by Primary MD next visit.    Activity: As tolerated with Full fall precautions use walker/cane & assistance as needed   Disposition Home    Diet: Heart Healthy/carb modified.   On your next visit with your primary care physician please Get Medicines reviewed and adjusted.   Please request your Prim.MD to go over all Hospital Tests and Procedure/Radiological results at the follow up, please get all Hospital records sent to your Prim MD by signing hospital release before you go home.   If you experience worsening of your admission symptoms, develop shortness of breath, life threatening emergency, suicidal or homicidal thoughts you must seek medical attention immediately by calling 911 or calling your MD immediately  if symptoms less severe.  You Must read complete instructions/literature along with all the possible adverse reactions/side effects for all the Medicines you take and that have been prescribed to you. Take any new Medicines after you have completely understood and accpet all the possible adverse reactions/side effects.   Do not drive, operating heavy machinery, perform activities at heights, swimming or participation in water activities or provide baby sitting services if your were admitted for syncope or siezures until you have seen by Primary MD or a Neurologist and advised to do so again.  Do not drive when taking Pain medications.    Do not take more than prescribed Pain, Sleep and Anxiety Medications  Special Instructions: If you have smoked or chewed Tobacco  in the last 2 yrs please stop smoking, stop any regular Alcohol  and or any Recreational drug use.  Wear Seat belts while driving.   Please note  You were cared for by a hospitalist during your hospital stay. If you have any questions about your discharge medications or the care you received while you were in the hospital after you  are discharged, you can call the unit and asked to speak with the hospitalist on call if the hospitalist that took care of you is not available. Once you are discharged, your primary care physician will handle any further medical issues. Please note that NO REFILLS for any discharge medications will be authorized once you are discharged, as it is imperative that you return to your primary care physician (or establish a relationship with a primary care physician if you do not have one) for your aftercare needs so that they can reassess your need for medications and monitor your lab values.

## 2023-12-30 NOTE — Discharge Summary (Signed)
 Physician Discharge Summary  Mercedes  Mercedes Lucas FMW:[id number] DOB: 1955-04-04 DOA: 12/25/2023  PCP: [name], PA  Admit date: 12/25/2023 Discharge date: 12/30/2023  Admitted From: (Home) Disposition:  (Home)  Recommendations for Outpatient Follow-up:  Follow up with PCP in 1-2 weeks Please obtain BMP/CBC in one week Continue counseling about tobacco abuse   Diet recommendation: Heart Healthy / Carb Modified  Brief/Interim Summary:  Asley  L Magno is a 68 y.o. female with medical history significant for essential hypertension, hyperlipidemia, current tobacco user, denies formal diagnosis of asthma or COPD, who presents to the ER due to worsening productive cough and shortness of breath for the past few days.  Her dyspnea is worse with exertion.  Initially went to urgent care on 12/16/2023 and was diagnosed with moderate persistent asthma with acute exacerbation.  She was advised to closely monitor her oxygen level at home and go to the ER if her oxygen level dropped.  Endorses hypoxemia at home with O2 saturation in the 80s on room air.  EMS was activated.  She received DuoNebs, IV Solu-Medrol, and IV magnesium  en route.     In the ER, tachycardic and tachypneic.  Hypoxic with O2 saturation of 88% on room air after several breathing treatments.  Chest x-ray was nonacute.  Due to concern for possible asthma exacerbation, bronchitis with bacterial infection, the patient was started on IV Levaquin.  Admitted by [name], hospitalist service.     Acute respiratory failure with hypoxia Acute bronchitis  COPD versus asthma exacerbation  - Has been on as needed Symbicort by her PCP, has not seen pulmonary, no diagnosis of asthma or COPD . - On IV Solu-Medrol, wheezing has improved, tapered her IV Solu-Medrol, she will be discharged on prednisone  taper . - Chest x-ray with no acute findings . - Initially on levofloxacin, narrowed to doxycycline   -Continue with as needed nebs. -Encouraged  use incentive spirometry and flutter valve -Viral panel is negative, COVID, RSV and ones are negative - ambulatory referral with pulmonary been sent   Leukocytosis -This is most likely in the setting of steroid use as an outpatient, reviewed records at her [facility] PCP, was within normal limit recently   Hypertension Resume home Norvasc   Oral thrush  -most likely from Symbicort, will start on Magic mouthwash she counseled to swish and spit at her every use   Hyperlipidemia Resume home Lipitor   Tobacco use disorder Recommend complete cessation of tobacco use   Hyperkalemia - Resolved with Lokelma   Diabetes mellitus -A1c is 6.7, CBG uncontrolled due to steroids - On sliding scale during hospital stay, continue home metformin  on discharge, but increased her dosing to twice daily     Discharge Diagnoses:  Principal Problem:   Acute respiratory failure with hypoxia [name]    Discharge Instructions  Discharge Instructions     Diet - low sodium heart healthy   Complete by: As directed    Discharge instructions   Complete by: As directed    Follow with Primary MD [name], PA in 7 days   Get CBC, CMP,checked  by Primary MD next visit.    Activity: As tolerated with Full fall precautions use walker/cane & assistance as needed   Disposition Home    Diet: Heart Healthy/carb modified   On your next visit with your primary care physician please Get Medicines reviewed and adjusted.   Please request your Prim.MD to go over all Hospital Tests and Procedure/Radiological results at the follow up, please get  all Hospital records sent to your Prim MD by signing hospital release before you go home.   If you experience worsening of your admission symptoms, develop shortness of breath, life threatening emergency, suicidal or homicidal thoughts you must seek medical attention immediately by calling 911 or calling your MD immediately  if symptoms less severe.  You Must read  complete instructions/literature along with all the possible adverse reactions/side effects for all the Medicines you take and that have been prescribed to you. Take any new Medicines after you have completely understood and accpet all the possible adverse reactions/side effects.   Do not drive, operating heavy machinery, perform activities at heights, swimming or participation in water  activities or provide baby sitting services if your were admitted for syncope or siezures until you have seen by Primary MD or a Neurologist and advised to do so again.  Do not drive when taking Pain medications.    Do not take more than prescribed Pain, Sleep and Anxiety Medications  Special Instructions: If you have smoked or chewed Tobacco  in the last 2 yrs please stop smoking, stop any regular Alcohol  and or any Recreational drug use.  Wear Seat belts while driving.   Please note  You were cared for by a hospitalist during your hospital stay. If you have any questions about your discharge medications or the care you received while you were in the hospital after you are discharged, you can call the unit and asked to speak with the hospitalist on call if the hospitalist that took care of you is not available. Once you are discharged, your primary care physician will handle any further medical issues. Please note that NO REFILLS for any discharge medications will be authorized once you are discharged, as it is imperative that you return to your primary care physician (or establish a relationship with a primary care physician if you do not have one) for your aftercare needs so that they can reassess your need for medications and monitor your lab values.   Increase activity slowly   Complete by: As directed       Allergies as of 12/30/2023       Reactions   Bee Venom Anaphylaxis   Erythromycin Anaphylaxis, Nausea Only   Codeine Nausea Only   Tetracycline Hcl Other (See Comments)    drunk feeling    Adhesive [tape] Rash   From bandaids        Medication List     STOP taking these medications    acetaminophen  500 MG tablet Commonly known as: TYLENOL    celecoxib  200 MG capsule Commonly known as: CELEBREX    gabapentin 300 MG capsule Commonly known as: NEURONTIN       TAKE these medications    albuterol  108 (90 Base) MCG/ACT inhaler Commonly known as: VENTOLIN  HFA Inhale 1-2 puffs into the lungs every 6 (six) hours as needed. What changed: reasons to take this   amLODipine  5 MG tablet Commonly known as: NORVASC  TAKE 1 TABLET(5 MG) BY MOUTH DAILY What changed:  how much to take how to take this when to take this additional instructions   atorvastatin  40 MG tablet Commonly known as: LIPITOR Take 1 tablet (40 mg total) by mouth daily. What changed: when to take this   CENTRUM SILVER ULTRA WOMENS PO Take 0.5 tablets by mouth daily at 12 noon.   doxycycline  100 MG tablet Commonly known as: VIBRA -TABS Take 1 tablet (100 mg total) by mouth every 12 (twelve) hours for 3  days.   escitalopram  20 MG tablet Commonly known as: LEXAPRO  Take 20 mg by mouth daily at 12 noon.   estradiol  2 MG tablet Commonly known as: ESTRACE  Take 2 mg by mouth daily.   Fluticasone -Salmeterol 100-50 MCG/DOSE Aepb Commonly known as: ADVAIR Inhale 1 puff into the lungs daily. What changed:  when to take this reasons to take this   hydrochlorothiazide  12.5 MG tablet Commonly known as: HYDRODIURIL  Take 12.5 mg by mouth daily.   ipratropium-albuterol  0.5-2.5 (3) MG/3ML Soln Commonly known as: DUONEB Take 3 mLs by nebulization every 4 (four) hours as needed.   lisinopril  40 MG tablet Commonly known as: ZESTRIL  Take 40 mg by mouth daily.   magic mouthwash Soln Take 5 mLs by mouth 4 (four) times daily for 7 days. Suspension contains equal amounts of Maalox Extra Strength, nystatin, and diphenhydramine .   metFORMIN  500 MG tablet Commonly known as: GLUCOPHAGE  Take 1 tablet  (500 mg total) by mouth 2 (two) times daily with a meal. What changed: when to take this   montelukast  10 MG tablet Commonly known as: SINGULAIR  Take 1 tablet (10 mg total) by mouth at bedtime.   nicotine  21 mg/24hr patch Commonly known as: NICODERM CQ  - dosed in mg/24 hours Place 1 patch (21 mg total) onto the skin daily.   pantoprazole 40 MG tablet Commonly known as: PROTONIX Take 1 tablet (40 mg total) by mouth daily.   predniSONE  10 MG (21) Tbpk tablet Commonly known as: STERAPRED UNI-PAK 21 TAB Please use per package instruction        Allergies  Allergen Reactions   Bee Venom Anaphylaxis   Erythromycin Anaphylaxis and Nausea Only   Codeine Nausea Only   Tetracycline Hcl Other (See Comments)     drunk feeling   Adhesive [Tape] Rash    From bandaids     Consultations: None  Procedures/Studies: DG Chest Port 1 View Result Date: 12/29/2023 EXAM: 1 VIEW(S) XRAY OF THE CHEST 12/29/2023 02:29:30 PM COMPARISON: 12/26/2022 CLINICAL HISTORY: Cough FINDINGS: LUNGS AND PLEURA: No focal pulmonary opacity. No pleural effusion. No pneumothorax. HEART AND MEDIASTINUM: No acute abnormality of the cardiac and mediastinal silhouettes. BONES AND SOFT TISSUES: No acute osseous abnormality. IMPRESSION: 1. No acute process. Electronically signed by: Fonda Field MD 12/29/2023 05:50 PM EST RP Workstation: [id number]   DG Chest Portable 1 View Result Date: 12/26/2023 EXAM: 1 VIEW(S) XRAY OF THE CHEST 12/26/2023 12:48:43 AM COMPARISON: Comparison with 12/16/2023. CLINICAL HISTORY: eval for cough/wheezing FINDINGS: LUNGS AND PLEURA: No focal pulmonary opacity. No pleural effusion. No pneumothorax. HEART AND MEDIASTINUM: No acute abnormality of the cardiac and mediastinal silhouettes. BONES AND SOFT TISSUES: No acute osseous abnormality. IMPRESSION: 1. No acute cardiopulmonary process. Electronically signed by: Norman Gatlin MD 12/26/2023 12:55 AM EST RP Workstation: [id number]   DG  Chest 2 View Result Date: 12/16/2023 EXAM: 2 VIEW(S) XRAY OF THE CHEST 12/16/2023 07:10:33 PM COMPARISON: Chest x-ray 03/10/23 CLINICAL HISTORY: Shortness of breath. FINDINGS: LUNGS AND PLEURA: No focal pulmonary opacity. No pulmonary edema. No pleural effusion. No pneumothorax. HEART AND MEDIASTINUM: No acute abnormality of the cardiac and mediastinal silhouettes. Atherosclerotic plaque. BONES AND SOFT TISSUES: No acute osseous abnormality. IMPRESSION: 1. No acute process. Electronically signed by: Morgane Naveau MD 12/16/2023 07:33 PM EST RP Workstation: [id number]      Subjective:  Reports ambulated in the hallway yesterday with no hypoxia, but she does appear anxious this morning. Discharge Exam: Vitals:   12/29/23 2353 12/30/23 0852  BP: (!) 146/77  Pulse: 61 70  Resp: 16 16  Temp: 97.9 F (36.6 C)   SpO2: 94% 95%   Vitals:   12/29/23 1713 12/29/23 2021 12/29/23 2353 12/30/23 0852  BP: (!) 151/80 (!) 154/69 (!) 146/77   Pulse: 70 70 61 70  Resp: 17 18 16 16   Temp:  97.8 F (36.6 C) 97.9 F (36.6 C)   TempSrc:  Oral Oral   SpO2: 91% 95% 94% 95%  Weight:      Height:        General: Pt is alert, awake, not in acute distress Cardiovascular: RRR, S1/S2 +, no rubs, no gallops Respiratory: No respiratory distress, mild end expiratory wheeze Abdominal: Soft, NT, ND, bowel sounds + Extremities: no edema, no cyanosis    The results of significant diagnostics from this hospitalization (including imaging, microbiology, ancillary and laboratory) are listed below for reference.     Microbiology: Recent Results (from the past 240 hours)  Resp panel by RT-PCR (RSV, Flu A&B, Covid) Anterior Nasal Swab     Status: None   Collection Time: 12/26/23  1:49 AM   Specimen: Anterior Nasal Swab  Result Value Ref Range Status   SARS Coronavirus 2 by RT PCR NEGATIVE NEGATIVE Final   Influenza A by PCR NEGATIVE NEGATIVE Final   Influenza B by PCR NEGATIVE NEGATIVE Final    Comment:  (NOTE) The Xpert Xpress SARS-CoV-2/FLU/RSV plus assay is intended as an aid in the diagnosis of influenza from Nasopharyngeal swab specimens and should not be used as a sole basis for treatment. Nasal washings and aspirates are unacceptable for Xpert Xpress SARS-CoV-2/FLU/RSV testing.  Fact Sheet for Patients: bloggercourse.com  Fact Sheet for Healthcare Providers: seriousbroker.it  This test is not yet approved or cleared by the [place]  FDA and has been authorized for detection and/or diagnosis of SARS-CoV-2 by FDA under an Emergency Use Authorization (EUA). This EUA will remain in effect (meaning this test can be used) for the duration of the COVID-19 declaration under Section 564(b)(1) of the Act, 21 U.S.C. section 360bbb-3(b)(1), unless the authorization is terminated or revoked.     Resp Syncytial Virus by PCR NEGATIVE NEGATIVE Final    Comment: (NOTE) Fact Sheet for Patients: bloggercourse.com  Fact Sheet for Healthcare Providers: seriousbroker.it  This test is not yet approved or cleared by the [place]  FDA and has been authorized for detection and/or diagnosis of SARS-CoV-2 by FDA under an Emergency Use Authorization (EUA). This EUA will remain in effect (meaning this test can be used) for the duration of the COVID-19 declaration under Section 564(b)(1) of the Act, 21 U.S.C. section 360bbb-3(b)(1), unless the authorization is terminated or revoked.  Performed at [facility], [address], [place], [place] [address]   Respiratory (~20 pathogens) panel by PCR     Status: None   Collection Time: 12/27/23  5:32 AM   Specimen: Nasopharyngeal Swab; Respiratory  Result Value Ref Range Status   Adenovirus NOT DETECTED NOT DETECTED Final   Coronavirus 229E NOT DETECTED NOT DETECTED Final    Comment: (NOTE) The Coronavirus on the Respiratory Panel, DOES  NOT test for the novel  Coronavirus (2019 nCoV)    Coronavirus HKU1 NOT DETECTED NOT DETECTED Final   Coronavirus NL63 NOT DETECTED NOT DETECTED Final   Coronavirus OC43 NOT DETECTED NOT DETECTED Final   Metapneumovirus NOT DETECTED NOT DETECTED Final   Rhinovirus / Enterovirus NOT DETECTED NOT DETECTED Final   Influenza A NOT DETECTED NOT DETECTED Final   Influenza  B NOT DETECTED NOT DETECTED Final   Parainfluenza Virus 1 NOT DETECTED NOT DETECTED Final   Parainfluenza Virus 2 NOT DETECTED NOT DETECTED Final   Parainfluenza Virus 3 NOT DETECTED NOT DETECTED Final   Parainfluenza Virus 4 NOT DETECTED NOT DETECTED Final   Respiratory Syncytial Virus NOT DETECTED NOT DETECTED Final   Bordetella pertussis NOT DETECTED NOT DETECTED Final   Bordetella Parapertussis NOT DETECTED NOT DETECTED Final   Chlamydophila pneumoniae NOT DETECTED NOT DETECTED Final   Mycoplasma pneumoniae NOT DETECTED NOT DETECTED Final    Comment: Performed at [facility], [address], [place], [place] [address]     Labs: BNP (last 3 results) Recent Labs    12/26/23 0028  BNP 32.4   Basic Metabolic Panel: Recent Labs  Lab 12/26/23 0028 12/26/23 0039 12/27/23 0232 12/28/23 0350 12/29/23 0426 12/30/23 0349  NA 135 136 137 137 137 137  K 3.7 3.7 5.4* 4.4 5.1 4.6  CL 103  --  104 106 104 103  CO2 20*  --  23 22 25 27   GLUCOSE 149*  --  199* 154* 108* 136*  BUN 23  --  27* 27* 30* 30*  CREATININE 1.17*  --  1.06* 0.92 0.95 0.93  CALCIUM  10.2  --  10.4* 10.0 10.4* 10.8*   Liver Function Tests: Recent Labs  Lab 12/26/23 0028  AST 24  ALT 46*  ALKPHOS 64  BILITOT 0.7  PROT 6.4*  ALBUMIN 3.5   No results for input(s): LIPASE, AMYLASE in the last 168 hours. No results for input(s): AMMONIA in the last 168 hours. CBC: Recent Labs  Lab 12/26/23 0028 12/26/23 0039 12/27/23 0232 12/28/23 0350 12/29/23 0426 12/30/23 0349  WBC 27.8*  --  38.6* 33.6* 28.5* 22.8*  NEUTROABS 18.1*   --   --   --   --   --   HGB 13.8 14.3 12.2 12.6 12.4 13.4  HCT 43.3 42.0 37.9 39.0 38.4 41.7  MCV 88.5  --  86.9 86.9 86.9 87.6  PLT 255  --  236 236 222 227   Cardiac Enzymes: No results for input(s): CKTOTAL, CKMB, CKMBINDEX, TROPONINI in the last 168 hours. BNP: Invalid input(s): POCBNP CBG: Recent Labs  Lab 12/29/23 0813 12/29/23 1203 12/29/23 1602 12/29/23 2121 12/30/23 0844  GLUCAP 95 103* 164* 144* 104*   D-Dimer No results for input(s): DDIMER in the last 72 hours. Hgb A1c No results for input(s): HGBA1C in the last 72 hours. Lipid Profile No results for input(s): CHOL, HDL, LDLCALC, TRIG, CHOLHDL, LDLDIRECT in the last 72 hours. Thyroid function studies No results for input(s): TSH, T4TOTAL, T3FREE, THYROIDAB in the last 72 hours.  Invalid input(s): FREET3 Anemia work up No results for input(s): VITAMINB12, FOLATE, FERRITIN, TIBC, IRON, RETICCTPCT in the last 72 hours. Urinalysis    Component Value Date/Time   BILIRUBINUR negative 08/15/2015 1650   BILIRUBINUR neg 05/19/2014 1432   KETONESUR negative 08/15/2015 1650   PROTEINUR negative 08/15/2015 1650   PROTEINUR 100 05/19/2014 1432   UROBILINOGEN 0.2 08/15/2015 1650   NITRITE Positive (A) 08/15/2015 1650   NITRITE positive 05/19/2014 1432   LEUKOCYTESUR Trace (A) 08/15/2015 1650   Sepsis Labs Recent Labs  Lab 12/27/23 0232 12/28/23 0350 12/29/23 0426 12/30/23 0349  WBC 38.6* 33.6* 28.5* 22.8*   Microbiology Recent Results (from the past 240 hours)  Resp panel by RT-PCR (RSV, Flu A&B, Covid) Anterior Nasal Swab     Status: None   Collection Time: 12/26/23  1:49 AM   Specimen: Anterior Nasal Swab  Result Value Ref Range Status   SARS Coronavirus 2 by RT PCR NEGATIVE NEGATIVE Final   Influenza A by PCR NEGATIVE NEGATIVE Final   Influenza B by PCR NEGATIVE NEGATIVE Final    Comment: (NOTE) The Xpert Xpress SARS-CoV-2/FLU/RSV plus assay is intended  as an aid in the diagnosis of influenza from Nasopharyngeal swab specimens and should not be used as a sole basis for treatment. Nasal washings and aspirates are unacceptable for Xpert Xpress SARS-CoV-2/FLU/RSV testing.  Fact Sheet for Patients: bloggercourse.com  Fact Sheet for Healthcare Providers: seriousbroker.it  This test is not yet approved or cleared by the [place]  FDA and has been authorized for detection and/or diagnosis of SARS-CoV-2 by FDA under an Emergency Use Authorization (EUA). This EUA will remain in effect (meaning this test can be used) for the duration of the COVID-19 declaration under Section 564(b)(1) of the Act, 21 U.S.C. section 360bbb-3(b)(1), unless the authorization is terminated or revoked.     Resp Syncytial Virus by PCR NEGATIVE NEGATIVE Final    Comment: (NOTE) Fact Sheet for Patients: bloggercourse.com  Fact Sheet for Healthcare Providers: seriousbroker.it  This test is not yet approved or cleared by the [place]  FDA and has been authorized for detection and/or diagnosis of SARS-CoV-2 by FDA under an Emergency Use Authorization (EUA). This EUA will remain in effect (meaning this test can be used) for the duration of the COVID-19 declaration under Section 564(b)(1) of the Act, 21 U.S.C. section 360bbb-3(b)(1), unless the authorization is terminated or revoked.  Performed at [facility], [address] [place], [place] [address]   Respiratory (~20 pathogens) panel by PCR     Status: None   Collection Time: 12/27/23  5:32 AM   Specimen: Nasopharyngeal Swab; Respiratory  Result Value Ref Range Status   Adenovirus NOT DETECTED NOT DETECTED Final   Coronavirus 229E NOT DETECTED NOT DETECTED Final    Comment: (NOTE) The Coronavirus on the Respiratory Panel, DOES NOT test for the novel  Coronavirus (2019 nCoV)    Coronavirus  HKU1 NOT DETECTED NOT DETECTED Final   Coronavirus NL63 NOT DETECTED NOT DETECTED Final   Coronavirus OC43 NOT DETECTED NOT DETECTED Final   Metapneumovirus NOT DETECTED NOT DETECTED Final   Rhinovirus / Enterovirus NOT DETECTED NOT DETECTED Final   Influenza A NOT DETECTED NOT DETECTED Final   Influenza B NOT DETECTED NOT DETECTED Final   Parainfluenza Virus 1 NOT DETECTED NOT DETECTED Final   Parainfluenza Virus 2 NOT DETECTED NOT DETECTED Final   Parainfluenza Virus 3 NOT DETECTED NOT DETECTED Final   Parainfluenza Virus 4 NOT DETECTED NOT DETECTED Final   Respiratory Syncytial Virus NOT DETECTED NOT DETECTED Final   Bordetella pertussis NOT DETECTED NOT DETECTED Final   Bordetella Parapertussis NOT DETECTED NOT DETECTED Final   Chlamydophila pneumoniae NOT DETECTED NOT DETECTED Final   Mycoplasma pneumoniae NOT DETECTED NOT DETECTED Final    Comment: Performed at [facility], [address], [place], [place] [address]     Time coordinating discharge: Over 30 minutes  SIGNED:   Brayton Lye, MD  [facility] 12/30/2023, 9:19 AM Pager   If 7PM-7AM, please contact night-coverage [email or web address] Password [email or web address]

## 2023-12-30 NOTE — Progress Notes (Signed)
 Discharge   Patient expressed verbal understanding of discharge POC.   Patient given time to ask any questions.  Additional education included in AVS.  Alert oriented in good spirits.   PIV removed from left AC/forearm.  Adjacent to ecchymotic area that has gauze dressing.  (not documented as placed) Pressure dressings intact.  TOC meds in process.  Ride at bedside.   Discharging to lounge.

## 2023-12-31 ENCOUNTER — Ambulatory Visit: Admitting: Pulmonary Disease

## 2023-12-31 ENCOUNTER — Encounter: Payer: Self-pay | Admitting: Pulmonary Disease

## 2023-12-31 VITALS — BP 116/72 | HR 66 | Temp 98.5°F | Ht 65.0 in | Wt 179.0 lb

## 2023-12-31 DIAGNOSIS — J4530 Mild persistent asthma, uncomplicated: Secondary | ICD-10-CM | POA: Diagnosis not present

## 2023-12-31 DIAGNOSIS — R052 Subacute cough: Secondary | ICD-10-CM

## 2023-12-31 DIAGNOSIS — J4541 Moderate persistent asthma with (acute) exacerbation: Secondary | ICD-10-CM

## 2023-12-31 DIAGNOSIS — R0609 Other forms of dyspnea: Secondary | ICD-10-CM | POA: Diagnosis not present

## 2023-12-31 MED ORDER — FLUTICASONE-SALMETEROL 500-50 MCG/ACT IN AEPB: 1.0000 | INHALATION_SPRAY | Freq: Two times a day (BID) | RESPIRATORY_TRACT | 11 refills | Status: DC

## 2023-12-31 MED ORDER — NYSTATIN 100000 UNIT/ML MT SUSP: 5.0000 mL | Freq: Four times a day (QID) | OROMUCOSAL | 0 refills | Status: DC

## 2023-12-31 NOTE — Progress Notes (Unsigned)
 @Patient  ID: Mercedes  L Lucas, female    DOB: 10/15/1955, 68 y.o.   MRN: [id number]  Chief Complaint  Patient presents with  . Consult    Follow up after hospial visit,asthma    Referring provider: Elgergawy, Brayton RAMAN, MD  HPI:   PMH:  Smoker/ Smoking History:  Maintenance:   Pt of:   12/31/2023  - Visit     Questionaires / Pulmonary Flowsheets:   ACT:      No data to display          MMRC:     No data to display          Epworth:      No data to display          Tests:   FENO:  No results found for: NITRICOXIDE  PFT:     No data to display          WALK:      No data to display          Imaging: DG Chest Port 1 View Result Date: 12/29/2023 EXAM: 1 VIEW(S) XRAY OF THE CHEST 12/29/2023 02:29:30 PM COMPARISON: 12/26/2022 CLINICAL HISTORY: Cough FINDINGS: LUNGS AND PLEURA: No focal pulmonary opacity. No pleural effusion. No pneumothorax. HEART AND MEDIASTINUM: No acute abnormality of the cardiac and mediastinal silhouettes. BONES AND SOFT TISSUES: No acute osseous abnormality. IMPRESSION: 1. No acute process. Electronically signed by: Fonda Field MD 12/29/2023 05:50 PM EST RP Workstation: [id number]   DG Chest Portable 1 View Result Date: 12/26/2023 EXAM: 1 VIEW(S) XRAY OF THE CHEST 12/26/2023 12:48:43 AM COMPARISON: Comparison with 12/16/2023. CLINICAL HISTORY: eval for cough/wheezing FINDINGS: LUNGS AND PLEURA: No focal pulmonary opacity. No pleural effusion. No pneumothorax. HEART AND MEDIASTINUM: No acute abnormality of the cardiac and mediastinal silhouettes. BONES AND SOFT TISSUES: No acute osseous abnormality. IMPRESSION: 1. No acute cardiopulmonary process. Electronically signed by: Norman Gatlin MD 12/26/2023 12:55 AM EST RP Workstation: [id number]   DG Chest 2 View Result Date: 12/16/2023 EXAM: 2 VIEW(S) XRAY OF THE CHEST 12/16/2023 07:10:33 PM COMPARISON: Chest x-ray 03/10/23 CLINICAL HISTORY: Shortness of breath.  FINDINGS: LUNGS AND PLEURA: No focal pulmonary opacity. No pulmonary edema. No pleural effusion. No pneumothorax. HEART AND MEDIASTINUM: No acute abnormality of the cardiac and mediastinal silhouettes. Atherosclerotic plaque. BONES AND SOFT TISSUES: No acute osseous abnormality. IMPRESSION: 1. No acute process. Electronically signed by: Kate Plummer MD 12/16/2023 07:33 PM EST RP Workstation: [id number]    Lab Results:  CBC    Component Value Date/Time   WBC 22.8 (H) 12/30/2023 0349   RBC 4.76 12/30/2023 0349   HGB 13.4 12/30/2023 0349   HCT 41.7 12/30/2023 0349   PLT 227 12/30/2023 0349   MCV 87.6 12/30/2023 0349   MCH 28.2 12/30/2023 0349   MCHC 32.1 12/30/2023 0349   RDW 14.9 12/30/2023 0349   LYMPHSABS 5.8 (H) 12/26/2023 0028   MONOABS 0.8 12/26/2023 0028   EOSABS 2.5 (H) 12/26/2023 0028   BASOSABS 0.6 (H) 12/26/2023 0028    BMET    Component Value Date/Time   NA 137 12/30/2023 0349   K 4.6 12/30/2023 0349   CL 103 12/30/2023 0349   CO2 27 12/30/2023 0349   GLUCOSE 136 (H) 12/30/2023 0349   BUN 30 (H) 12/30/2023 0349   CREATININE 0.93 12/30/2023 0349   CREATININE 0.74 08/15/2015 1618   CALCIUM  10.8 (H) 12/30/2023 0349   GFRNONAA >60 12/30/2023 0349   GFRNONAA >89 12/30/2014 0839   GFRAA >89 12/30/2014  9160    BNP    Component Value Date/Time   BNP 32.4 12/26/2023 0028    ProBNP No results found for: PROBNP  Specialty Problems       Pulmonary Problems   Asthma, mild intermittent   Acute respiratory failure with hypoxia (HCC)    Allergies  Allergen Reactions  . Bee Venom Anaphylaxis  . Erythromycin Anaphylaxis and Nausea Only  . Codeine Nausea Only  . Tetracycline Hcl Other (See Comments)     drunk feeling  . Adhesive [Tape] Rash    From bandaids     Immunization History  Administered Date(s) Administered  . Fluad Trivalent(High Dose 65+) 11/29/2023  . Influenza,inj,Quad PF,6+ Mos 12/24/2014  . PFIZER(Purple Top)SARS-COV-2 Vaccination  03/21/2019, 04/15/2019  . Pneumococcal Polysaccharide-23 06/12/2016, 04/20/2020    Past Medical History:  Diagnosis Date  . Allergy   . Anemia   . Arthritis   . Asthma    mild-no attacks  . Headache    migraines occasional  . Hypercholesteremia   . Hypertension   . Pneumonia   . PONV (postoperative nausea and vomiting)   . Pre-diabetes     Tobacco History: Social History   Tobacco Use  Smoking Status Every Day  . Current packs/day: 1.00  . Average packs/day: 1 pack/day for 43.0 years (43.0 ttl pk-yrs)  . Types: Cigarettes  Smokeless Tobacco Never   Ready to quit: Not Answered Counseling given: Not Answered   Continue to not smoke  Outpatient Encounter Medications as of 12/31/2023  Medication Sig  . albuterol  (VENTOLIN  HFA) 108 (90 Base) MCG/ACT inhaler Inhale 1-2 puffs into the lungs every 6 (six) hours as needed.  . amLODipine  (NORVASC ) 5 MG tablet TAKE 1 TABLET(5 MG) BY MOUTH DAILY  . atorvastatin  (LIPITOR) 40 MG tablet Take 1 tablet (40 mg total) by mouth daily.  . doxycycline  (VIBRA -TABS) 100 MG tablet Take 1 tablet (100 mg total) by mouth every 12 (twelve) hours for 3 days.  . escitalopram  (LEXAPRO ) 20 MG tablet Take 20 mg by mouth daily at 12 noon.  . estradiol  (ESTRACE ) 2 MG tablet Take 2 mg by mouth daily.  . fluticasone -salmeterol (ADVAIR DISKUS) 500-50 MCG/ACT AEPB Inhale 1 puff into the lungs in the morning and at bedtime.  . hydrochlorothiazide  (HYDRODIURIL ) 12.5 MG tablet Take 12.5 mg by mouth daily.  SABRA ipratropium-albuterol  (DUONEB) 0.5-2.5 (3) MG/3ML SOLN Take 3 mLs by nebulization every 4 (four) hours as needed.  . lisinopril  (ZESTRIL ) 40 MG tablet Take 40 mg by mouth daily.  . metFORMIN  (GLUCOPHAGE ) 500 MG tablet Take 1 tablet (500 mg total) by mouth 2 (two) times daily with a meal.  . montelukast  (SINGULAIR ) 10 MG tablet Take 1 tablet (10 mg total) by mouth at bedtime.  . Multiple Vitamins-Minerals (CENTRUM SILVER ULTRA WOMENS PO) Take 0.5 tablets  by mouth daily at 12 noon.  . nicotine  (NICODERM CQ  - DOSED IN MG/24 HOURS) 21 mg/24hr patch Place 1 patch (21 mg total) onto the skin daily.  SABRA nystatin (MYCOSTATIN) 100000 UNIT/ML suspension Take 5 mLs (500,000 Units total) by mouth 4 (four) times daily for 10 days.  . pantoprazole (PROTONIX) 40 MG tablet Take 1 tablet (40 mg total) by mouth daily.  . predniSONE  (STERAPRED UNI-PAK 21 TAB) 10 MG (21) TBPK tablet Please use per package instruction  . [DISCONTINUED] magic mouthwash SOLN Take 5 mLs by mouth 4 (four) times daily for 7 days. Suspension contains equal amounts of Maalox Extra Strength, nystatin, and diphenhydramine .  . [DISCONTINUED] Fluticasone -Salmeterol (  ADVAIR) 100-50 MCG/DOSE AEPB Inhale 1 puff into the lungs daily. (Patient not taking: Reported on 12/31/2023)   No facility-administered encounter medications on file as of 12/31/2023.     Review of Systems  Review of Systems   Physical Exam  BP 116/72   Pulse 66   Temp 98.5 F (36.9 C) (Oral)   Ht 5' 5 (1.651 m)   Wt 179 lb (81.2 kg)   SpO2 96%   BMI 29.79 kg/m   Wt Readings from Last 5 Encounters:  12/31/23 179 lb (81.2 kg)  12/26/23 172 lb 13.5 oz (78.4 kg)  01/24/21 172 lb 12.8 oz (78.4 kg)  01/13/21 172 lb 12.8 oz (78.4 kg)  11/02/20 178 lb 3.2 oz (80.8 kg)    BMI Readings from Last 5 Encounters:  12/31/23 29.79 kg/m  12/26/23 28.76 kg/m  01/24/21 28.76 kg/m  01/13/21 28.76 kg/m  11/02/20 29.65 kg/m     Physical Exam    Assessment & Plan:   Subacute cough, bronchitis symptoms concerning for development of asthma: With wheeze cough congestion shortness of breath.  Worsened over time.  Recent hospitalization with improvement in symptoms with IV steroids, breathing treatments inhalers etc.  Continues with mild wheeze on exam.  Escalate inhaler from low-dose Wixela to high-dose brand Advair discus as this has been more successful in the past.  PFTs ordered for further evaluation.  Mouth  discomfort: Possible thrush with recent steroid course, inhalers, some white foamy substance on tongue but also with tingling, foul taste.  Fluconazole  prescribed prior to seeing to help.  Prescription for nystatin swish and spit, 4 times daily for 10 days sent today.   Return in about 3 months (around 04/01/2024) for f/u Dr. Annella, after PFT.   Mercedes JONELLE Annella, MD 12/31/2023   This appointment required *** minutes of patient care (this includes precharting, chart review, review of results, face-to-face care, etc.).

## 2023-12-31 NOTE — Patient Instructions (Addendum)
 Nice to meet you  I think something like asthma is possible  For further evaluation I recommend pulmonary function test, these were ordered today, schedule at your convenience in the next several weeks before your next visit with me  To better treat this take Advair discus (purple disc) 1 puff twice a day, rinse your mouth with water  after use.  If this is too expensive you may need to return to [facility] but I will put you on the higher dose of Wixela.  The current or prior prescription is the very lowest dose.  For the mouth discomfort, use nystatin swish and spit 5 mL 4 times a day.  Return to clinic in 3 months or sooner as needed with Dr. Annella

## 2024-01-01 ENCOUNTER — Other Ambulatory Visit (HOSPITAL_COMMUNITY): Payer: Self-pay

## 2024-01-01 ENCOUNTER — Telehealth: Payer: Self-pay

## 2024-01-01 NOTE — Telephone Encounter (Signed)
*  Pulm  Pharmacy Patient Advocate Encounter   Received notification from Fax that prior authorization for Brand Advair Diskus is required/requested.   Insurance verification completed.   The patient is insured through [facility].   Per test claim:  Brand Symbicort, Breo Ellipta, Dulera  is preferred by the insurance.  If suggested medication is appropriate, Please send in a new RX and discontinue this one. If not, please advise as to why it's not appropriate so that we may request a Prior Authorization. Please note, some preferred medications may still require a PA.  If the suggested medications have not been trialed and there are no contraindications to their use, the PA will not be submitted, as it will not be approved.   Brand Symbicort- $235.64 Brand Breo Ellipta- $271.86 Dulera - $324.89  *Patient has a deductible causing higher prices at this time.

## 2024-01-02 NOTE — Telephone Encounter (Signed)
 Dr. Judeth Horn, please advise.

## 2024-01-08 NOTE — Telephone Encounter (Addendum)
ATC x1.  LMTCB. 

## 2024-01-22 MED ORDER — BUDESONIDE-FORMOTEROL FUMARATE 160-4.5 MCG/ACT IN AERO: 2.0000 | INHALATION_SPRAY | Freq: Two times a day (BID) | RESPIRATORY_TRACT | 6 refills | Status: AC

## 2024-01-22 NOTE — Addendum Note (Signed)
 Addended by: Lylianna Fraiser K on: 01/22/2024 04:38 PM   Modules accepted: Orders

## 2024-01-22 NOTE — Telephone Encounter (Signed)
 Patient still has thrush and is requesting refill on magic mouthwash. She says she has a little more left. I believe you sent some 12/31/23

## 2024-01-23 MED ORDER — NYSTATIN 100000 UNIT/ML MT SUSP: 5.0000 mL | Freq: Four times a day (QID) | OROMUCOSAL | 0 refills | Status: AC

## 2024-01-23 NOTE — Telephone Encounter (Signed)
 ATC x1.  LVM letting her know refill has been completed.  LVM to return call.

## 2024-01-23 NOTE — Telephone Encounter (Signed)
 Nystatin  mouth wash refilled.

## 2024-01-23 NOTE — Addendum Note (Signed)
 Addended byBETHA ANNELLA COUGH on: 01/23/2024 12:07 PM   Modules accepted: Orders

## 2024-01-28 NOTE — Telephone Encounter (Signed)
 I called and spoke with patient and advised her refill of nystatin  had been sent in for her.  She stated she got that one, however she could not afford the Budesonide -formoterol  (symbicort ), she said it was $159.  I let her know I would send a message to our pharmacy team to see what is on the formulary for her insurance and once we hear back from them we will call her back.  She verbalized understanding.  She does have Wixela to use for now.  PA team, Please advise on what is on formulary comperable to budesonide -formoterol  (symbicort ).  Thank you.

## 2024-01-29 ENCOUNTER — Other Ambulatory Visit (HOSPITAL_COMMUNITY): Payer: Self-pay

## 2024-01-30 NOTE — Telephone Encounter (Signed)
 ATC patient.  LVM to return call.  Sent mychart message as well.

## 2024-04-02 ENCOUNTER — Encounter

## 2024-04-02 ENCOUNTER — Ambulatory Visit: Admitting: Pulmonary Disease
